# Patient Record
Sex: Female | Born: 1935 | Race: White | Hispanic: No | Smoking: Former smoker
Health system: Southern US, Community
[De-identification: ages and names within clinical notes are randomized; demographics above are authoritative.]

## PROBLEM LIST (undated history)

## (undated) DIAGNOSIS — F329 Major depressive disorder, single episode, unspecified: Secondary | ICD-10-CM

## (undated) DIAGNOSIS — F419 Anxiety disorder, unspecified: Secondary | ICD-10-CM

## (undated) DIAGNOSIS — C159 Malignant neoplasm of esophagus, unspecified: Secondary | ICD-10-CM

## (undated) DIAGNOSIS — I509 Heart failure, unspecified: Secondary | ICD-10-CM

## (undated) DIAGNOSIS — E785 Hyperlipidemia, unspecified: Secondary | ICD-10-CM

## (undated) DIAGNOSIS — N289 Disorder of kidney and ureter, unspecified: Secondary | ICD-10-CM

## (undated) DIAGNOSIS — C3490 Malignant neoplasm of unspecified part of unspecified bronchus or lung: Secondary | ICD-10-CM

## (undated) DIAGNOSIS — D649 Anemia, unspecified: Secondary | ICD-10-CM

## (undated) DIAGNOSIS — F32A Depression, unspecified: Secondary | ICD-10-CM

## (undated) DIAGNOSIS — M1990 Unspecified osteoarthritis, unspecified site: Secondary | ICD-10-CM

## (undated) DIAGNOSIS — K219 Gastro-esophageal reflux disease without esophagitis: Secondary | ICD-10-CM

## (undated) DIAGNOSIS — I10 Essential (primary) hypertension: Secondary | ICD-10-CM

## (undated) DIAGNOSIS — R55 Syncope and collapse: Secondary | ICD-10-CM

## (undated) HISTORY — DX: Essential (primary) hypertension: I10

## (undated) HISTORY — DX: Hyperlipidemia, unspecified: E78.5

## (undated) HISTORY — DX: Gastro-esophageal reflux disease without esophagitis: K21.9

## (undated) HISTORY — DX: Syncope and collapse: R55

## (undated) HISTORY — DX: Anemia, unspecified: D64.9

## (undated) HISTORY — PX: ESOPHAGEAL DILATION: SHX303

## (undated) HISTORY — PX: CAROTID ENDARTERECTOMY: SUR193

## (undated) HISTORY — PX: CARDIAC CATHETERIZATION: SHX172

## (undated) HISTORY — DX: Disorder of kidney and ureter, unspecified: N28.9

## (undated) HISTORY — PX: PARTIAL HYSTERECTOMY: SHX80

## (undated) HISTORY — PX: INSERT / REPLACE / REMOVE PACEMAKER: SUR710

## (undated) HISTORY — DX: Depression, unspecified: F32.A

## (undated) HISTORY — DX: Major depressive disorder, single episode, unspecified: F32.9

## (undated) HISTORY — DX: Unspecified osteoarthritis, unspecified site: M19.90

## (undated) HISTORY — DX: Malignant neoplasm of unspecified part of unspecified bronchus or lung: C34.90

## (undated) HISTORY — PX: WRIST FRACTURE SURGERY: SHX121

## (undated) HISTORY — DX: Malignant neoplasm of esophagus, unspecified: C15.9

## (undated) HISTORY — DX: Anxiety disorder, unspecified: F41.9

---

## 2003-11-13 ENCOUNTER — Ambulatory Visit (HOSPITAL_COMMUNITY): Admission: RE | Admit: 2003-11-13 | Discharge: 2003-11-13 | Payer: Self-pay

## 2004-01-07 ENCOUNTER — Ambulatory Visit (HOSPITAL_COMMUNITY): Admission: RE | Admit: 2004-01-07 | Discharge: 2004-01-08 | Payer: Self-pay

## 2004-05-12 ENCOUNTER — Ambulatory Visit: Payer: Self-pay | Admitting: Gastroenterology

## 2004-05-27 ENCOUNTER — Emergency Department: Payer: Self-pay | Admitting: General Practice

## 2004-06-02 ENCOUNTER — Ambulatory Visit: Payer: Self-pay | Admitting: Internal Medicine

## 2004-07-22 ENCOUNTER — Emergency Department: Payer: Self-pay | Admitting: Emergency Medicine

## 2004-09-23 ENCOUNTER — Ambulatory Visit: Payer: Self-pay | Admitting: Ophthalmology

## 2004-10-04 ENCOUNTER — Emergency Department: Payer: Self-pay | Admitting: Emergency Medicine

## 2004-11-17 ENCOUNTER — Ambulatory Visit: Payer: Self-pay | Admitting: Ophthalmology

## 2005-03-27 ENCOUNTER — Ambulatory Visit: Payer: Self-pay | Admitting: Gastroenterology

## 2005-04-23 ENCOUNTER — Ambulatory Visit: Payer: Self-pay | Admitting: General Practice

## 2005-04-24 ENCOUNTER — Emergency Department: Payer: Self-pay | Admitting: Unknown Physician Specialty

## 2005-04-26 ENCOUNTER — Ambulatory Visit: Payer: Self-pay | Admitting: General Practice

## 2005-08-05 ENCOUNTER — Ambulatory Visit: Payer: Self-pay | Admitting: General Practice

## 2005-09-18 ENCOUNTER — Ambulatory Visit: Payer: Self-pay | Admitting: Internal Medicine

## 2005-11-24 ENCOUNTER — Ambulatory Visit: Payer: Self-pay | Admitting: Internal Medicine

## 2005-12-01 ENCOUNTER — Ambulatory Visit: Payer: Self-pay | Admitting: Internal Medicine

## 2005-12-10 ENCOUNTER — Ambulatory Visit: Payer: Self-pay | Admitting: Orthopaedic Surgery

## 2006-02-04 ENCOUNTER — Other Ambulatory Visit: Payer: Self-pay

## 2006-02-05 ENCOUNTER — Ambulatory Visit: Payer: Self-pay | Admitting: General Practice

## 2006-03-05 ENCOUNTER — Encounter: Payer: Self-pay | Admitting: General Practice

## 2006-06-15 ENCOUNTER — Ambulatory Visit: Payer: Self-pay | Admitting: Internal Medicine

## 2006-06-16 ENCOUNTER — Ambulatory Visit: Payer: Self-pay | Admitting: Internal Medicine

## 2006-06-25 ENCOUNTER — Emergency Department: Payer: Self-pay | Admitting: General Practice

## 2006-06-26 ENCOUNTER — Ambulatory Visit: Payer: Self-pay | Admitting: Internal Medicine

## 2006-12-24 ENCOUNTER — Ambulatory Visit: Payer: Self-pay | Admitting: Internal Medicine

## 2007-01-03 ENCOUNTER — Ambulatory Visit: Payer: Self-pay | Admitting: Internal Medicine

## 2007-01-04 ENCOUNTER — Ambulatory Visit: Payer: Self-pay | Admitting: Internal Medicine

## 2007-01-08 ENCOUNTER — Ambulatory Visit: Payer: Self-pay | Admitting: Internal Medicine

## 2007-08-10 ENCOUNTER — Ambulatory Visit: Payer: Self-pay | Admitting: Internal Medicine

## 2007-08-28 ENCOUNTER — Emergency Department: Payer: Self-pay | Admitting: Emergency Medicine

## 2008-02-09 ENCOUNTER — Ambulatory Visit: Payer: Self-pay | Admitting: Internal Medicine

## 2008-02-15 ENCOUNTER — Ambulatory Visit: Payer: Self-pay

## 2008-02-15 ENCOUNTER — Other Ambulatory Visit: Payer: Self-pay

## 2008-02-20 ENCOUNTER — Emergency Department: Payer: Self-pay | Admitting: Emergency Medicine

## 2008-02-21 ENCOUNTER — Ambulatory Visit: Payer: Self-pay | Admitting: Unknown Physician Specialty

## 2008-05-04 ENCOUNTER — Ambulatory Visit: Payer: Self-pay | Admitting: Internal Medicine

## 2009-03-04 ENCOUNTER — Other Ambulatory Visit: Payer: Self-pay | Admitting: Unknown Physician Specialty

## 2009-03-05 ENCOUNTER — Ambulatory Visit: Payer: Self-pay | Admitting: Unknown Physician Specialty

## 2009-03-12 ENCOUNTER — Ambulatory Visit: Payer: Self-pay | Admitting: Oncology

## 2009-03-15 ENCOUNTER — Ambulatory Visit: Payer: Self-pay | Admitting: Oncology

## 2009-03-19 ENCOUNTER — Ambulatory Visit: Payer: Self-pay | Admitting: Oncology

## 2009-03-27 ENCOUNTER — Ambulatory Visit: Payer: Self-pay | Admitting: Oncology

## 2009-04-12 ENCOUNTER — Ambulatory Visit: Payer: Self-pay | Admitting: Cardiovascular Disease

## 2009-04-12 ENCOUNTER — Ambulatory Visit: Payer: Self-pay | Admitting: General Surgery

## 2009-04-17 ENCOUNTER — Inpatient Hospital Stay: Payer: Self-pay | Admitting: Surgery

## 2009-04-26 ENCOUNTER — Ambulatory Visit: Payer: Self-pay | Admitting: Oncology

## 2009-05-03 ENCOUNTER — Ambulatory Visit: Payer: Self-pay | Admitting: General Surgery

## 2009-05-27 ENCOUNTER — Ambulatory Visit: Payer: Self-pay | Admitting: Oncology

## 2009-06-26 ENCOUNTER — Ambulatory Visit: Payer: Self-pay | Admitting: Oncology

## 2009-07-27 ENCOUNTER — Ambulatory Visit: Payer: Self-pay | Admitting: Oncology

## 2009-09-10 ENCOUNTER — Ambulatory Visit: Payer: Self-pay | Admitting: Oncology

## 2009-09-24 ENCOUNTER — Ambulatory Visit: Payer: Self-pay | Admitting: Oncology

## 2009-10-25 ENCOUNTER — Ambulatory Visit: Payer: Self-pay | Admitting: Oncology

## 2009-11-11 ENCOUNTER — Ambulatory Visit: Payer: Self-pay | Admitting: Internal Medicine

## 2009-11-24 ENCOUNTER — Ambulatory Visit: Payer: Self-pay | Admitting: Oncology

## 2009-12-10 ENCOUNTER — Ambulatory Visit: Payer: Self-pay | Admitting: Unknown Physician Specialty

## 2009-12-24 ENCOUNTER — Ambulatory Visit: Payer: Self-pay

## 2009-12-24 ENCOUNTER — Ambulatory Visit: Payer: Self-pay | Admitting: Oncology

## 2009-12-25 ENCOUNTER — Ambulatory Visit: Payer: Self-pay

## 2009-12-25 ENCOUNTER — Ambulatory Visit: Payer: Self-pay | Admitting: Oncology

## 2009-12-26 ENCOUNTER — Ambulatory Visit: Payer: Self-pay | Admitting: Oncology

## 2010-01-24 ENCOUNTER — Ambulatory Visit: Payer: Self-pay | Admitting: Oncology

## 2010-02-18 ENCOUNTER — Ambulatory Visit: Payer: Self-pay | Admitting: Gastroenterology

## 2010-02-24 ENCOUNTER — Ambulatory Visit: Payer: Self-pay | Admitting: Oncology

## 2010-02-24 ENCOUNTER — Ambulatory Visit: Payer: Self-pay | Admitting: Gastroenterology

## 2010-03-08 ENCOUNTER — Inpatient Hospital Stay: Payer: Self-pay | Admitting: Internal Medicine

## 2010-03-27 ENCOUNTER — Ambulatory Visit: Payer: Self-pay | Admitting: Oncology

## 2010-04-18 ENCOUNTER — Ambulatory Visit: Payer: Self-pay | Admitting: Internal Medicine

## 2010-04-26 ENCOUNTER — Ambulatory Visit: Payer: Self-pay | Admitting: Oncology

## 2010-05-27 ENCOUNTER — Ambulatory Visit: Payer: Self-pay | Admitting: Oncology

## 2010-06-26 ENCOUNTER — Ambulatory Visit: Payer: Self-pay | Admitting: Oncology

## 2010-07-27 ENCOUNTER — Ambulatory Visit: Payer: Self-pay | Admitting: Oncology

## 2010-08-27 ENCOUNTER — Ambulatory Visit: Payer: Self-pay | Admitting: Oncology

## 2010-09-01 ENCOUNTER — Ambulatory Visit: Payer: Self-pay | Admitting: Internal Medicine

## 2010-09-15 ENCOUNTER — Ambulatory Visit: Payer: Self-pay | Admitting: Vascular Surgery

## 2010-09-25 ENCOUNTER — Ambulatory Visit: Payer: Self-pay | Admitting: Oncology

## 2010-10-26 ENCOUNTER — Ambulatory Visit: Payer: Self-pay | Admitting: Oncology

## 2010-11-25 ENCOUNTER — Ambulatory Visit: Payer: Self-pay | Admitting: Oncology

## 2010-12-26 ENCOUNTER — Ambulatory Visit: Payer: Self-pay | Admitting: Oncology

## 2011-01-25 ENCOUNTER — Ambulatory Visit: Payer: Self-pay | Admitting: Oncology

## 2011-02-17 ENCOUNTER — Ambulatory Visit: Payer: Self-pay | Admitting: Oncology

## 2011-02-25 ENCOUNTER — Ambulatory Visit: Payer: Self-pay | Admitting: Oncology

## 2011-03-28 ENCOUNTER — Ambulatory Visit: Payer: Self-pay | Admitting: Oncology

## 2011-04-27 ENCOUNTER — Ambulatory Visit: Payer: Self-pay | Admitting: Oncology

## 2011-05-28 ENCOUNTER — Ambulatory Visit: Payer: Self-pay | Admitting: Oncology

## 2011-06-27 ENCOUNTER — Ambulatory Visit: Payer: Self-pay | Admitting: Oncology

## 2011-07-31 ENCOUNTER — Ambulatory Visit: Payer: Self-pay | Admitting: Oncology

## 2011-07-31 LAB — CBC CANCER CENTER
Basophil #: 0 x10 3/mm (ref 0.0–0.1)
Eosinophil #: 0 x10 3/mm (ref 0.0–0.7)
HCT: 33.3 % — ABNORMAL LOW (ref 35.0–47.0)
Platelet: 213 x10 3/mm (ref 150–440)
RBC: 3.27 10*6/uL — ABNORMAL LOW (ref 3.80–5.20)
RDW: 12.8 % (ref 11.5–14.5)
WBC: 6.6 x10 3/mm (ref 3.6–11.0)

## 2011-07-31 LAB — COMPREHENSIVE METABOLIC PANEL
Albumin: 3.8 g/dL (ref 3.4–5.0)
Alkaline Phosphatase: 149 U/L — ABNORMAL HIGH (ref 50–136)
Calcium, Total: 9.2 mg/dL (ref 8.5–10.1)
EGFR (Non-African Amer.): 29 — ABNORMAL LOW
Glucose: 135 mg/dL — ABNORMAL HIGH (ref 65–99)
Osmolality: 280 (ref 275–301)
SGPT (ALT): 19 U/L
Sodium: 136 mmol/L (ref 136–145)

## 2011-08-28 ENCOUNTER — Ambulatory Visit: Payer: Self-pay | Admitting: Oncology

## 2013-02-20 ENCOUNTER — Ambulatory Visit: Payer: Self-pay | Admitting: Gastroenterology

## 2013-04-13 ENCOUNTER — Ambulatory Visit: Payer: Self-pay | Admitting: Internal Medicine

## 2013-04-20 ENCOUNTER — Ambulatory Visit: Payer: Self-pay | Admitting: Gastroenterology

## 2013-04-24 LAB — PATHOLOGY REPORT

## 2013-08-15 ENCOUNTER — Inpatient Hospital Stay: Payer: Self-pay | Admitting: Internal Medicine

## 2013-08-15 LAB — BASIC METABOLIC PANEL
Anion Gap: 9 (ref 7–16)
BUN: 30 mg/dL — AB (ref 7–18)
CHLORIDE: 111 mmol/L — AB (ref 98–107)
CREATININE: 1.6 mg/dL — AB (ref 0.60–1.30)
Calcium, Total: 9.1 mg/dL (ref 8.5–10.1)
Co2: 22 mmol/L (ref 21–32)
EGFR (African American): 34 — ABNORMAL LOW
EGFR (Non-African Amer.): 30 — ABNORMAL LOW
Glucose: 98 mg/dL (ref 65–99)
Osmolality: 289 (ref 275–301)
POTASSIUM: 4.3 mmol/L (ref 3.5–5.1)
SODIUM: 142 mmol/L (ref 136–145)

## 2013-08-15 LAB — PROTIME-INR
INR: 1
PROTHROMBIN TIME: 13.3 s (ref 11.5–14.7)

## 2013-08-15 LAB — TROPONIN I
TROPONIN-I: 0.07 ng/mL — AB
TROPONIN-I: 0.05 ng/mL

## 2013-08-15 LAB — CBC WITH DIFFERENTIAL/PLATELET
BASOS PCT: 0.2 %
Basophil #: 0 10*3/uL (ref 0.0–0.1)
Eosinophil #: 0.1 10*3/uL (ref 0.0–0.7)
Eosinophil %: 1.2 %
HCT: 30.3 % — AB (ref 35.0–47.0)
HGB: 10.2 g/dL — AB (ref 12.0–16.0)
Lymphocyte #: 0.5 10*3/uL — ABNORMAL LOW (ref 1.0–3.6)
Lymphocyte %: 8.4 %
MCH: 31.8 pg (ref 26.0–34.0)
MCHC: 33.7 g/dL (ref 32.0–36.0)
MCV: 94 fL (ref 80–100)
MONOS PCT: 9.9 %
Monocyte #: 0.6 x10 3/mm (ref 0.2–0.9)
NEUTROS ABS: 4.8 10*3/uL (ref 1.4–6.5)
Neutrophil %: 80.3 %
PLATELETS: 153 10*3/uL (ref 150–440)
RBC: 3.22 10*6/uL — AB (ref 3.80–5.20)
RDW: 15.9 % — AB (ref 11.5–14.5)
WBC: 6 10*3/uL (ref 3.6–11.0)

## 2013-08-15 LAB — APTT
ACTIVATED PTT: 32 s (ref 23.6–35.9)
Activated PTT: 131.5 secs — ABNORMAL HIGH (ref 23.6–35.9)

## 2013-08-15 LAB — CK TOTAL AND CKMB (NOT AT ARMC)
CK, Total: 55 U/L (ref 21–215)
CK-MB: 0.9 ng/mL (ref 0.5–3.6)

## 2013-08-15 LAB — CK-MB: CK-MB: 1.3 ng/mL (ref 0.5–3.6)

## 2013-08-16 LAB — APTT: Activated PTT: 88.9 secs — ABNORMAL HIGH (ref 23.6–35.9)

## 2013-08-16 LAB — TROPONIN I: TROPONIN-I: 0.06 ng/mL — AB

## 2013-08-16 LAB — LIPID PANEL
Cholesterol: 107 mg/dL (ref 0–200)
HDL Cholesterol: 63 mg/dL — ABNORMAL HIGH (ref 40–60)
LDL CHOLESTEROL, CALC: 34 mg/dL (ref 0–100)
TRIGLYCERIDES: 51 mg/dL (ref 0–200)
VLDL Cholesterol, Calc: 10 mg/dL (ref 5–40)

## 2013-08-16 LAB — CK-MB: CK-MB: 1 ng/mL (ref 0.5–3.6)

## 2013-08-17 LAB — BASIC METABOLIC PANEL
ANION GAP: 5 — AB (ref 7–16)
BUN: 37 mg/dL — AB (ref 7–18)
CHLORIDE: 106 mmol/L (ref 98–107)
CREATININE: 1.98 mg/dL — AB (ref 0.60–1.30)
Calcium, Total: 9.1 mg/dL (ref 8.5–10.1)
Co2: 24 mmol/L (ref 21–32)
EGFR (African American): 27 — ABNORMAL LOW
GFR CALC NON AF AMER: 23 — AB
Glucose: 85 mg/dL (ref 65–99)
OSMOLALITY: 278 (ref 275–301)
POTASSIUM: 4.5 mmol/L (ref 3.5–5.1)
SODIUM: 135 mmol/L — AB (ref 136–145)

## 2013-08-17 LAB — CBC WITH DIFFERENTIAL/PLATELET
BASOS ABS: 0 10*3/uL (ref 0.0–0.1)
Basophil %: 0.3 %
EOS ABS: 0.1 10*3/uL (ref 0.0–0.7)
Eosinophil %: 2.3 %
HCT: 28.3 % — ABNORMAL LOW (ref 35.0–47.0)
HGB: 9.5 g/dL — ABNORMAL LOW (ref 12.0–16.0)
LYMPHS ABS: 0.6 10*3/uL — AB (ref 1.0–3.6)
Lymphocyte %: 11 %
MCH: 31.4 pg (ref 26.0–34.0)
MCHC: 33.5 g/dL (ref 32.0–36.0)
MCV: 94 fL (ref 80–100)
MONOS PCT: 16 %
Monocyte #: 0.8 x10 3/mm (ref 0.2–0.9)
NEUTROS PCT: 70.4 %
Neutrophil #: 3.7 10*3/uL (ref 1.4–6.5)
PLATELETS: 149 10*3/uL — AB (ref 150–440)
RBC: 3.02 10*6/uL — ABNORMAL LOW (ref 3.80–5.20)
RDW: 15.8 % — AB (ref 11.5–14.5)
WBC: 5.2 10*3/uL (ref 3.6–11.0)

## 2013-09-04 ENCOUNTER — Ambulatory Visit: Payer: Self-pay | Admitting: Oncology

## 2013-09-06 LAB — CBC CANCER CENTER
BASOS ABS: 0 x10 3/mm (ref 0.0–0.1)
BASOS PCT: 0.4 %
Eosinophil #: 0.1 x10 3/mm (ref 0.0–0.7)
Eosinophil %: 1.1 %
HCT: 26.4 % — AB (ref 35.0–47.0)
HGB: 8.4 g/dL — ABNORMAL LOW (ref 12.0–16.0)
Lymphocyte #: 0.8 x10 3/mm — ABNORMAL LOW (ref 1.0–3.6)
Lymphocyte %: 12.4 %
MCH: 29.8 pg (ref 26.0–34.0)
MCHC: 31.9 g/dL — ABNORMAL LOW (ref 32.0–36.0)
MCV: 94 fL (ref 80–100)
Monocyte #: 1 x10 3/mm — ABNORMAL HIGH (ref 0.2–0.9)
Monocyte %: 17 %
NEUTROS PCT: 69.1 %
Neutrophil #: 4.2 x10 3/mm (ref 1.4–6.5)
Platelet: 182 x10 3/mm (ref 150–440)
RBC: 2.83 10*6/uL — AB (ref 3.80–5.20)
RDW: 18.3 % — ABNORMAL HIGH (ref 11.5–14.5)
WBC: 6 x10 3/mm (ref 3.6–11.0)

## 2013-09-06 LAB — IRON AND TIBC
Iron Bind.Cap.(Total): 504 ug/dL — ABNORMAL HIGH (ref 250–450)
Iron Saturation: 5 %
Iron: 26 ug/dL — ABNORMAL LOW (ref 50–170)
Unbound Iron-Bind.Cap.: 478 ug/dL

## 2013-09-06 LAB — FERRITIN: FERRITIN (ARMC): 5 ng/mL — AB (ref 8–388)

## 2013-09-22 LAB — CBC CANCER CENTER
BASOS PCT: 0.4 %
Basophil #: 0 x10 3/mm (ref 0.0–0.1)
EOS PCT: 1.4 %
Eosinophil #: 0.1 x10 3/mm (ref 0.0–0.7)
HCT: 34.9 % — AB (ref 35.0–47.0)
HGB: 11.3 g/dL — AB (ref 12.0–16.0)
LYMPHS ABS: 0.6 x10 3/mm — AB (ref 1.0–3.6)
LYMPHS PCT: 13.7 %
MCH: 30.5 pg (ref 26.0–34.0)
MCHC: 32.3 g/dL (ref 32.0–36.0)
MCV: 95 fL (ref 80–100)
MONO ABS: 0.7 x10 3/mm (ref 0.2–0.9)
MONOS PCT: 14.4 %
NEUTROS ABS: 3.3 x10 3/mm (ref 1.4–6.5)
Neutrophil %: 70.1 %
Platelet: 197 x10 3/mm (ref 150–440)
RBC: 3.69 10*6/uL — AB (ref 3.80–5.20)
RDW: 21.3 % — ABNORMAL HIGH (ref 11.5–14.5)
WBC: 4.7 x10 3/mm (ref 3.6–11.0)

## 2013-09-22 LAB — COMPREHENSIVE METABOLIC PANEL
ALBUMIN: 3.9 g/dL (ref 3.4–5.0)
ALK PHOS: 110 U/L
ALT: 23 U/L (ref 12–78)
AST: 20 U/L (ref 15–37)
Anion Gap: 11 (ref 7–16)
BILIRUBIN TOTAL: 0.4 mg/dL (ref 0.2–1.0)
BUN: 33 mg/dL — ABNORMAL HIGH (ref 7–18)
CALCIUM: 8.9 mg/dL (ref 8.5–10.1)
Chloride: 107 mmol/L (ref 98–107)
Co2: 22 mmol/L (ref 21–32)
Creatinine: 1.77 mg/dL — ABNORMAL HIGH (ref 0.60–1.30)
EGFR (African American): 30 — ABNORMAL LOW
EGFR (Non-African Amer.): 26 — ABNORMAL LOW
GLUCOSE: 100 mg/dL — AB (ref 65–99)
OSMOLALITY: 287 (ref 275–301)
Potassium: 4.6 mmol/L (ref 3.5–5.1)
Sodium: 140 mmol/L (ref 136–145)
TOTAL PROTEIN: 7.2 g/dL (ref 6.4–8.2)

## 2013-09-24 ENCOUNTER — Ambulatory Visit: Payer: Self-pay | Admitting: Oncology

## 2013-09-29 ENCOUNTER — Emergency Department: Payer: Self-pay

## 2013-09-29 LAB — COMPREHENSIVE METABOLIC PANEL
ALBUMIN: 4.2 g/dL (ref 3.4–5.0)
ALK PHOS: 121 U/L — AB
ANION GAP: 3 — AB (ref 7–16)
AST: 35 U/L (ref 15–37)
BUN: 31 mg/dL — AB (ref 7–18)
Bilirubin,Total: 0.7 mg/dL (ref 0.2–1.0)
CALCIUM: 9.1 mg/dL (ref 8.5–10.1)
CHLORIDE: 110 mmol/L — AB (ref 98–107)
Co2: 24 mmol/L (ref 21–32)
Creatinine: 1.63 mg/dL — ABNORMAL HIGH (ref 0.60–1.30)
EGFR (African American): 34 — ABNORMAL LOW
GFR CALC NON AF AMER: 29 — AB
GLUCOSE: 105 mg/dL — AB (ref 65–99)
Osmolality: 281 (ref 275–301)
Potassium: 4.4 mmol/L (ref 3.5–5.1)
SGPT (ALT): 25 U/L (ref 12–78)
SODIUM: 137 mmol/L (ref 136–145)
Total Protein: 7.5 g/dL (ref 6.4–8.2)

## 2013-09-29 LAB — CBC
HCT: 35.3 % (ref 35.0–47.0)
HGB: 11.9 g/dL — ABNORMAL LOW (ref 12.0–16.0)
MCH: 31.8 pg (ref 26.0–34.0)
MCHC: 33.6 g/dL (ref 32.0–36.0)
MCV: 95 fL (ref 80–100)
Platelet: 143 10*3/uL — ABNORMAL LOW (ref 150–440)
RBC: 3.73 10*6/uL — AB (ref 3.80–5.20)
RDW: 21.4 % — ABNORMAL HIGH (ref 11.5–14.5)
WBC: 5 10*3/uL (ref 3.6–11.0)

## 2013-09-29 LAB — TROPONIN I: Troponin-I: <0.02 ng/mL

## 2013-11-02 ENCOUNTER — Ambulatory Visit: Payer: Self-pay | Admitting: Oncology

## 2013-11-03 ENCOUNTER — Inpatient Hospital Stay: Payer: Self-pay | Admitting: Internal Medicine

## 2013-11-03 LAB — IRON AND TIBC
IRON SATURATION: 46 %
Iron Bind.Cap.(Total): 250 ug/dL (ref 250–450)
Iron: 115 ug/dL (ref 50–170)
Unbound Iron-Bind.Cap.: 135 ug/dL

## 2013-11-03 LAB — TROPONIN I: Troponin-I: 0.13 ng/mL — ABNORMAL HIGH

## 2013-11-03 LAB — CBC CANCER CENTER
BASOS ABS: 0 x10 3/mm (ref 0.0–0.1)
Basophil %: 0.4 %
EOS ABS: 0.1 x10 3/mm (ref 0.0–0.7)
EOS PCT: 1.9 %
HCT: 33.5 % — ABNORMAL LOW (ref 35.0–47.0)
HGB: 11.1 g/dL — ABNORMAL LOW (ref 12.0–16.0)
LYMPHS ABS: 0.6 x10 3/mm — AB (ref 1.0–3.6)
Lymphocyte %: 9.5 %
MCH: 32.3 pg (ref 26.0–34.0)
MCHC: 33.2 g/dL (ref 32.0–36.0)
MCV: 97 fL (ref 80–100)
MONO ABS: 0.9 x10 3/mm (ref 0.2–0.9)
Monocyte %: 12.8 %
NEUTROS ABS: 5.2 x10 3/mm (ref 1.4–6.5)
NEUTROS PCT: 75.4 %
Platelet: 129 x10 3/mm — ABNORMAL LOW (ref 150–440)
RBC: 3.45 10*6/uL — AB (ref 3.80–5.20)
RDW: 20.7 % — ABNORMAL HIGH (ref 11.5–14.5)
WBC: 6.9 x10 3/mm (ref 3.6–11.0)

## 2013-11-03 LAB — URINALYSIS, COMPLETE
BILIRUBIN, UR: NEGATIVE
BLOOD: NEGATIVE
Bacteria: NONE SEEN
GLUCOSE, UR: NEGATIVE mg/dL (ref 0–75)
Ketone: NEGATIVE
NITRITE: NEGATIVE
PH: 5 (ref 4.5–8.0)
SPECIFIC GRAVITY: 1.017 (ref 1.003–1.030)
Squamous Epithelial: <1
WBC UR: 9 /HPF (ref 0–5)

## 2013-11-03 LAB — APTT: Activated PTT: 32.5 secs (ref 23.6–35.9)

## 2013-11-03 LAB — CK TOTAL AND CKMB (NOT AT ARMC)
CK, Total: 45 U/L
CK-MB: 1 ng/mL (ref 0.5–3.6)

## 2013-11-03 LAB — COMPREHENSIVE METABOLIC PANEL
ALK PHOS: 121 U/L — AB
ANION GAP: 9 (ref 7–16)
AST: 94 U/L — AB (ref 15–37)
Albumin: 3.4 g/dL (ref 3.4–5.0)
BUN: 34 mg/dL — ABNORMAL HIGH (ref 7–18)
Bilirubin,Total: 0.8 mg/dL (ref 0.2–1.0)
CALCIUM: 8.9 mg/dL (ref 8.5–10.1)
CREATININE: 2.04 mg/dL — AB (ref 0.60–1.30)
Chloride: 107 mmol/L (ref 98–107)
Co2: 22 mmol/L (ref 21–32)
GFR CALC AF AMER: 26 — AB
GFR CALC NON AF AMER: 22 — AB
Glucose: 107 mg/dL — ABNORMAL HIGH (ref 65–99)
Osmolality: 284 (ref 275–301)
Potassium: 4.2 mmol/L (ref 3.5–5.1)
SGPT (ALT): 95 U/L — ABNORMAL HIGH (ref 12–78)
Sodium: 138 mmol/L (ref 136–145)
TOTAL PROTEIN: 6.5 g/dL (ref 6.4–8.2)

## 2013-11-03 LAB — PROTIME-INR
INR: 1.1
Prothrombin Time: 14 secs (ref 11.5–14.7)

## 2013-11-03 LAB — MAGNESIUM: MAGNESIUM: 1.9 mg/dL

## 2013-11-03 LAB — FERRITIN: Ferritin (ARMC): 250 ng/mL (ref 8–388)

## 2013-11-04 LAB — LIPID PANEL
CHOLESTEROL: 96 mg/dL (ref 0–200)
HDL Cholesterol: 46 mg/dL (ref 40–60)
Ldl Cholesterol, Calc: 33 mg/dL (ref 0–100)
Triglycerides: 83 mg/dL (ref 0–200)
VLDL Cholesterol, Calc: 17 mg/dL (ref 5–40)

## 2013-11-04 LAB — CBC WITH DIFFERENTIAL/PLATELET
BASOS PCT: 0.2 %
Basophil #: 0 10*3/uL (ref 0.0–0.1)
EOS PCT: 2 %
Eosinophil #: 0.1 10*3/uL (ref 0.0–0.7)
HCT: 31 % — ABNORMAL LOW (ref 35.0–47.0)
HGB: 10.2 g/dL — ABNORMAL LOW (ref 12.0–16.0)
Lymphocyte #: 0.3 10*3/uL — ABNORMAL LOW (ref 1.0–3.6)
Lymphocyte %: 5.3 %
MCH: 31.8 pg (ref 26.0–34.0)
MCHC: 32.8 g/dL (ref 32.0–36.0)
MCV: 97 fL (ref 80–100)
MONOS PCT: 12.5 %
Monocyte #: 0.8 x10 3/mm (ref 0.2–0.9)
NEUTROS ABS: 4.9 10*3/uL (ref 1.4–6.5)
NEUTROS PCT: 80 %
Platelet: 105 10*3/uL — ABNORMAL LOW (ref 150–440)
RBC: 3.2 10*6/uL — AB (ref 3.80–5.20)
RDW: 21.1 % — AB (ref 11.5–14.5)
WBC: 6.2 10*3/uL (ref 3.6–11.0)

## 2013-11-04 LAB — HEPATIC FUNCTION PANEL A (ARMC)
ALBUMIN: 3.2 g/dL — AB (ref 3.4–5.0)
ALK PHOS: 101 U/L
Bilirubin, Direct: 0.2 mg/dL (ref 0.00–0.20)
Bilirubin,Total: 0.6 mg/dL (ref 0.2–1.0)
SGOT(AST): 49 U/L — ABNORMAL HIGH (ref 15–37)
SGPT (ALT): 61 U/L (ref 12–78)
TOTAL PROTEIN: 5.9 g/dL — AB (ref 6.4–8.2)

## 2013-11-04 LAB — BASIC METABOLIC PANEL
ANION GAP: 7 (ref 7–16)
BUN: 30 mg/dL — AB (ref 7–18)
CHLORIDE: 111 mmol/L — AB (ref 98–107)
CO2: 22 mmol/L (ref 21–32)
CREATININE: 1.72 mg/dL — AB (ref 0.60–1.30)
Calcium, Total: 9 mg/dL (ref 8.5–10.1)
GFR CALC AF AMER: 32 — AB
GFR CALC NON AF AMER: 27 — AB
GLUCOSE: 95 mg/dL (ref 65–99)
Osmolality: 285 (ref 275–301)
Potassium: 4.3 mmol/L (ref 3.5–5.1)
SODIUM: 140 mmol/L (ref 136–145)

## 2013-11-24 ENCOUNTER — Ambulatory Visit: Payer: Self-pay | Admitting: Oncology

## 2013-12-05 DIAGNOSIS — K219 Gastro-esophageal reflux disease without esophagitis: Secondary | ICD-10-CM | POA: Insufficient documentation

## 2013-12-12 ENCOUNTER — Ambulatory Visit: Payer: Self-pay | Admitting: Gastroenterology

## 2013-12-25 ENCOUNTER — Ambulatory Visit: Payer: Self-pay | Admitting: Gastroenterology

## 2013-12-29 ENCOUNTER — Ambulatory Visit: Payer: Self-pay | Admitting: Gastroenterology

## 2014-01-22 ENCOUNTER — Inpatient Hospital Stay: Payer: Self-pay | Admitting: Internal Medicine

## 2014-01-22 LAB — CK TOTAL AND CKMB (NOT AT ARMC)
CK, Total: 55 U/L
CK-MB: 2.6 ng/mL (ref 0.5–3.6)

## 2014-01-22 LAB — BASIC METABOLIC PANEL
ANION GAP: 16 (ref 7–16)
BUN: 31 mg/dL — AB (ref 7–18)
CALCIUM: 9 mg/dL (ref 8.5–10.1)
CHLORIDE: 103 mmol/L (ref 98–107)
Co2: 20 mmol/L — ABNORMAL LOW (ref 21–32)
Creatinine: 1.77 mg/dL — ABNORMAL HIGH (ref 0.60–1.30)
EGFR (African American): 30 — ABNORMAL LOW
EGFR (Non-African Amer.): 26 — ABNORMAL LOW
Glucose: 97 mg/dL (ref 65–99)
Osmolality: 284 (ref 275–301)
POTASSIUM: 4.2 mmol/L (ref 3.5–5.1)
Sodium: 139 mmol/L (ref 136–145)

## 2014-01-22 LAB — CBC
HCT: 36.1 % (ref 35.0–47.0)
HGB: 12 g/dL (ref 12.0–16.0)
MCH: 34.1 pg — ABNORMAL HIGH (ref 26.0–34.0)
MCHC: 33.2 g/dL (ref 32.0–36.0)
MCV: 103 fL — ABNORMAL HIGH (ref 80–100)
Platelet: 163 10*3/uL (ref 150–440)
RBC: 3.51 10*6/uL — AB (ref 3.80–5.20)
RDW: 13.6 % (ref 11.5–14.5)
WBC: 6.5 10*3/uL (ref 3.6–11.0)

## 2014-01-22 LAB — TROPONIN I
TROPONIN-I: 0.24 ng/mL — AB
Troponin-I: 0.27 ng/mL — ABNORMAL HIGH

## 2014-01-22 LAB — PRO B NATRIURETIC PEPTIDE: B-Type Natriuretic Peptide: 33098 pg/mL — ABNORMAL HIGH (ref 0–450)

## 2014-01-22 LAB — TSH: Thyroid Stimulating Horm: 1.48 u[IU]/mL

## 2014-01-24 LAB — CBC WITH DIFFERENTIAL/PLATELET
Basophil #: 0 10*3/uL (ref 0.0–0.1)
Basophil %: 0.4 %
EOS ABS: 0.1 10*3/uL (ref 0.0–0.7)
EOS PCT: 2 %
HCT: 36 % (ref 35.0–47.0)
HGB: 12.5 g/dL (ref 12.0–16.0)
LYMPHS ABS: 0.4 10*3/uL — AB (ref 1.0–3.6)
Lymphocyte %: 7.3 %
MCH: 35.6 pg — AB (ref 26.0–34.0)
MCHC: 34.7 g/dL (ref 32.0–36.0)
MCV: 102 fL — ABNORMAL HIGH (ref 80–100)
Monocyte #: 0.9 x10 3/mm (ref 0.2–0.9)
Monocyte %: 15 %
NEUTROS PCT: 75.3 %
Neutrophil #: 4.4 10*3/uL (ref 1.4–6.5)
Platelet: 163 10*3/uL (ref 150–440)
RBC: 3.51 10*6/uL — AB (ref 3.80–5.20)
RDW: 13.4 % (ref 11.5–14.5)
WBC: 5.9 10*3/uL (ref 3.6–11.0)

## 2014-01-24 LAB — BASIC METABOLIC PANEL
Anion Gap: 12 (ref 7–16)
BUN: 31 mg/dL — ABNORMAL HIGH (ref 7–18)
CHLORIDE: 101 mmol/L (ref 98–107)
CO2: 27 mmol/L (ref 21–32)
Calcium, Total: 8.9 mg/dL (ref 8.5–10.1)
Creatinine: 2 mg/dL — ABNORMAL HIGH (ref 0.60–1.30)
EGFR (Non-African Amer.): 23 — ABNORMAL LOW
GFR CALC AF AMER: 26 — AB
GLUCOSE: 73 mg/dL (ref 65–99)
Osmolality: 285 (ref 275–301)
POTASSIUM: 3.4 mmol/L — AB (ref 3.5–5.1)
SODIUM: 140 mmol/L (ref 136–145)

## 2014-01-27 LAB — CULTURE, BLOOD (SINGLE)

## 2014-02-02 ENCOUNTER — Ambulatory Visit: Payer: Self-pay | Admitting: Family

## 2014-03-05 DIAGNOSIS — R634 Abnormal weight loss: Secondary | ICD-10-CM | POA: Insufficient documentation

## 2014-03-13 ENCOUNTER — Ambulatory Visit: Payer: Self-pay | Admitting: Family

## 2014-03-27 ENCOUNTER — Ambulatory Visit: Payer: Self-pay | Admitting: Internal Medicine

## 2014-04-03 ENCOUNTER — Ambulatory Visit: Payer: Self-pay | Admitting: Family

## 2014-04-19 ENCOUNTER — Ambulatory Visit: Payer: Self-pay | Admitting: Family

## 2014-04-26 ENCOUNTER — Ambulatory Visit: Payer: Self-pay | Admitting: Internal Medicine

## 2014-05-04 ENCOUNTER — Ambulatory Visit: Payer: Self-pay | Admitting: Family

## 2014-05-06 ENCOUNTER — Emergency Department: Payer: Self-pay | Admitting: Emergency Medicine

## 2014-05-09 ENCOUNTER — Ambulatory Visit: Payer: Self-pay | Admitting: General Practice

## 2014-05-14 ENCOUNTER — Ambulatory Visit: Payer: Self-pay | Admitting: General Practice

## 2014-05-23 ENCOUNTER — Ambulatory Visit: Payer: Self-pay | Admitting: Family

## 2014-06-01 ENCOUNTER — Ambulatory Visit: Payer: Self-pay | Admitting: Internal Medicine

## 2014-08-07 ENCOUNTER — Ambulatory Visit: Payer: Self-pay | Admitting: Family

## 2014-08-20 ENCOUNTER — Emergency Department: Payer: Self-pay | Admitting: Emergency Medicine

## 2014-08-20 LAB — COMPREHENSIVE METABOLIC PANEL
ALBUMIN: 3.7 g/dL (ref 3.4–5.0)
ALK PHOS: 72 U/L
ANION GAP: 5 — AB (ref 7–16)
BILIRUBIN TOTAL: 0.6 mg/dL (ref 0.2–1.0)
BUN: 39 mg/dL — ABNORMAL HIGH (ref 7–18)
Calcium, Total: 9.2 mg/dL (ref 8.5–10.1)
Chloride: 110 mmol/L — ABNORMAL HIGH (ref 98–107)
Co2: 21 mmol/L (ref 21–32)
Creatinine: 1.8 mg/dL — ABNORMAL HIGH (ref 0.60–1.30)
EGFR (African American): 35 — ABNORMAL LOW
EGFR (Non-African Amer.): 29 — ABNORMAL LOW
GLUCOSE: 92 mg/dL (ref 65–99)
OSMOLALITY: 281 (ref 275–301)
Potassium: 5.7 mmol/L — ABNORMAL HIGH (ref 3.5–5.1)
SGOT(AST): 16 U/L (ref 15–37)
SGPT (ALT): 14 U/L
SODIUM: 136 mmol/L (ref 136–145)
TOTAL PROTEIN: 6.5 g/dL (ref 6.4–8.2)

## 2014-08-20 LAB — URINALYSIS, COMPLETE
BACTERIA: NONE SEEN
BLOOD: NEGATIVE
Bilirubin,UR: NEGATIVE
GLUCOSE, UR: NEGATIVE mg/dL (ref 0–75)
Ketone: NEGATIVE
Leukocyte Esterase: NEGATIVE
NITRITE: NEGATIVE
PH: 6 (ref 4.5–8.0)
Protein: 30
RBC,UR: 3 /HPF (ref 0–5)
Specific Gravity: 1.011 (ref 1.003–1.030)
Squamous Epithelial: NONE SEEN
WBC UR: 2 /HPF (ref 0–5)

## 2014-08-20 LAB — CBC
HCT: 37.3 % (ref 35.0–47.0)
HGB: 12 g/dL (ref 12.0–16.0)
MCH: 33.8 pg (ref 26.0–34.0)
MCHC: 32.3 g/dL (ref 32.0–36.0)
MCV: 105 fL — AB (ref 80–100)
PLATELETS: 78 10*3/uL — AB (ref 150–440)
RBC: 3.56 10*6/uL — AB (ref 3.80–5.20)
RDW: 13.4 % (ref 11.5–14.5)
WBC: 3.5 10*3/uL — ABNORMAL LOW (ref 3.6–11.0)

## 2014-08-20 LAB — TROPONIN I: TROPONIN-I: 0.03 ng/mL

## 2014-08-22 ENCOUNTER — Ambulatory Visit: Payer: Self-pay | Admitting: Family

## 2014-09-21 ENCOUNTER — Ambulatory Visit: Payer: Self-pay | Admitting: Family

## 2014-09-24 ENCOUNTER — Ambulatory Visit: Payer: Self-pay | Admitting: Oncology

## 2014-09-25 ENCOUNTER — Ambulatory Visit
Admit: 2014-09-25 | Disposition: A | Discharge status: Home / Self Care | Payer: Self-pay | Attending: Oncology | Admitting: Oncology

## 2014-10-08 ENCOUNTER — Ambulatory Visit: Payer: Self-pay | Admitting: Oncology

## 2014-10-12 DIAGNOSIS — N184 Chronic kidney disease, stage 4 (severe): Secondary | ICD-10-CM | POA: Insufficient documentation

## 2014-10-26 ENCOUNTER — Ambulatory Visit
Admit: 2014-10-26 | Disposition: A | Discharge status: Home / Self Care | Payer: Self-pay | Attending: Oncology | Admitting: Oncology

## 2014-10-27 DIAGNOSIS — I5022 Chronic systolic (congestive) heart failure: Secondary | ICD-10-CM | POA: Insufficient documentation

## 2014-11-17 NOTE — H&P (Signed)
PATIENT NAME:  Gloria Jarvis, Gloria Jarvis MR#:  [id number] DATE OF BIRTH:  October 31, 1928  DATE OF ADMISSION:  01/22/2014  PRIMARY CARE PHYSICIAN: Dr. Emily Filbert.   REFERRING EMERGENCY ROOM PHYSICIAN: Dr. Delman Kitten.   PRIMARY CARDIOLOGIST: Dr. Nehemiah Massed.   CHIEF COMPLAINT: Shortness of breath and chest pain.   HISTORY OF PRESENT ILLNESS: An 81 year old female with past history of hypertension, hyperlipidemia and she was noted to be having some conduction problem as per her in the past, so 2 months ago she received a cardiac pacemaker. After pacemaker, she has been having on and off episodes of feeling chest pain and shortness of breath. The pain is sharp, pressure-like, as if somebody is sitting on her chest, underneath her sternal bone and sometimes it happens when she is trying to walk on her treadmill. Once she takes some rest, the pain goes away. It is also associated with shortness of breath which is severe. episodes are happening more and more frequently, so finally she decided to come to the Emergency Room. Today morning, she ate her breakfast, but after she had this type of episode, she vomited and throw up her breakfast. That was the main triggering factor that made her come to the Emergency Room today after having one of these similar episodes. She did not know that her primary care physician had a walk-in clinic also where she can go and see without any appointment, so decided to come to the Emergency Room to seek help. On further questioning, she denies any sputum or fever. She does not have oxygen requirement at home. She is having some dry cough for the last few days.   REVIEW OF SYSTEMS:  CONSTITUTIONAL: Negative for fever, fatigue, weakness, pain or weight loss.  EYES: No blurring or double vision, discharge or redness.  EARS, NOSE, THROAT: No tinnitus, ear pain or hearing loss.  RESPIRATORY: The patient has of shortness of breath on walking.  CARDIOVASCULAR: Has chest pain on and off. No  palpitations, edema, arrhythmia or syncopal episode.  GASTROINTESTINAL: No nausea, vomit, diarrhea, abdominal pain. Had only 1 episode of vomiting today.    GENITOURINARY: No dysuria, hematuria or increased frequency.  ENDOCRINE: No heat or cold intolerance. No excessive sweating.  SKIN: No acne, rashes or lesions.  MUSCULOSKELETAL: No pain or swelling in the joints.  NEUROLOGICAL: No numbness, weakness, tremor or vertigo.  PSYCHIATRIC: No anxiety, insomnia, bipolar disorder.   PAST MEDICAL HISTORY:  1. Hypertension.  2. Hypercholesterolemia.  3. Chronic renal failure.  4. Status post pacemaker placement 2 months ago.   PAST SURGICAL HISTORY: Esophageal cancer. Part of esophagus and stomach were removed in 1989. She received radiation therapy for that. In 2010, she was diagnosed with lung cancer and had part of lung removed. Had some chemotherapy but did not tolerate it and decided to stop it. Had some renal failure secondary to that which is chronic now.   SOCIAL HISTORY: She was a smoker. She quit in 1989. No alcohol. No illicit drug use. Lives alone and very active in day-to-day life. No need for any support to walk.   FAMILY HISTORY: Mother and father both had coronary artery disease. Mother ended up having a pacemaker in her 24s, and father died in 27s with coronary artery disease.   HOME MEDICATIONS:  1. Pravastatin 20 mg oral once a day.  2. Omeprazole 20 mg oral 2 times a day.  3. Hydrochlorothiazide/triamterene 25/37.5 mg oral once a day.  4. Aspirin 81 mg once a day.  5. Amlodipine 10 mg oral once a day.   PHYSICAL EXAMINATION:  VITAL SIGNS: In the ER, temperature 98.1, pulse rate is 110, respirations 24, blood pressure 147/55 and pulse ox is 88 on room air which came up to 96 on oxygen supplementation 2 liters.  GENERAL: The patient is fully alert and oriented to time, place and person. She is thin. Very cooperative with history taking and physical examination. Does not  appear in any acute distress.  HEENT: Head and neck atraumatic. Conjunctiva pink. Oral mucosa moist.  NECK: Supple. No JVD.  RESPIRATORY: Bilateral crepitation present. Equal air entry.  CARDIOVASCULAR: S1, S2 present, regular. No murmur appreciated.  ABDOMEN: Soft, nontender. Bowel sounds present. No organomegaly.  SKIN: No rash.  LEGS: No edema.  NEUROLOGICAL: Power 5 out of 5. Follows commands. No gross abnormality.  PSYCHIATRIC: Does not appear in any acute psychiatric illness at this time.   IMPORTANT LABORATORY RESULTS: Cardiac catheterization done in January 2015 shows insignificant coronary artery disease, normal left ventricular ejection fraction 63%.   BNP is 33,098. Troponin 0.24. WBC 6.5, hemoglobin 12, platelet count is 163 and MCV is 103.   ASSESSMENT AND PLAN: An 81 year old female with a past medical history of hypertension, pacemaker placement, hypercholesterolemia and chronic renal failure, came to the Emergency Room after having recurrent episodes of retrosternal pain and shortness of breath and found to have elevated BNP and pulmonary edema on chest x-ray.  1. Congestive heart failure exacerbation: Most likely, this is diastolic heart failure. Ejection fraction was normal 6 months ago. This is new onset congestive heart failure, so will admit to telemetry, do troponin followups and will give intravenous Lasix. Will call cardiology consult and will get echocardiogram done to evaluate cardiac function.  2. Hypertension: Will give her intravenous Lasix and carvedilol for blood pressure control.  3. Hypercholesterolemia: Continue her pravastatin.   4. Chronic renal failure: We still do not have BMP report as there was some problem with the sample. Emergency Room physician sent it again, so we will have to follow that but she has chronic renal failure, creatinine around 2 in the past.  5. Elevated troponin of 0.24: In the setting of chronic renal failure, this might be expected,  but if it goes further higher and the patient has new onset respiratory distress with new pulmonary edema, I would like to start her on anticoagulation therapy if needed later on. Cardiology to see the patient.   CODE STATUS: DNR. Confirmed with the patient. Her healthcare power of attorney is her son.   TOTAL TIME SPENT ON THIS ADMISSION: 50 minutes.   ____________________________ Ceasar Lund Anselm Jungling, MD vgv:gb D: 01/22/2014 19:35:34 ET T: 01/23/2014 00:18:35 ET JOB#: [id number]  cc: Ceasar Lund. Anselm Jungling, MD, <Dictator> Rusty Aus, MD Vaughan Basta MD ELECTRONICALLY SIGNED 01/23/2014 16:45

## 2014-11-17 NOTE — Op Note (Signed)
PATIENT NAME:  Gloria Jarvis, Gloria Jarvis MR#:  [id number] DATE OF BIRTH:  07/03/1929  DATE OF PROCEDURE:  11/03/2013  PROCEDURE PERFORMED:  Dual-chamber pacemaker implantation (Medtronic).   PREPROCEDURE DIAGNOSES: Complete heart block with ventricular escape in the 30s, symptomatic, with acute on chronic renal insufficiency.     POSTPROCEDURE DIAGNOSES:   1.  Complete heart block with ventricular escape in the 30s, symptomatic, with acute on chronic renal insufficiency.    2.  Status post left upper extremity venogram (10 mL of IV contrast dye required secondary to low-riding clavicle and faint bone shadows with osteoporosis). Patent without focal stenosis, cine'd and road mapped for vascular access.  3.  Status post left-sided dual-chamber pacemaker implantation (Medtronic).   PRIMARY OPERATOR:  Fatima Sanger, M.D.   LOCATION:  OR #2, [facility].   MEDICATIONS:  1.  Cefazolin 1 gram IV.  2.  Sedation, monitored anesthesia care (see anesthesia note for anesthetic medications).   FLUOROSCOPY TIME:  Approximately 5 minutes (see radiology fluoroscopy note).   COMPLICATIONS: None.   ESTIMATED BLOOD LOSS:  Minimal.   INDICATIONS AND CONSENT:  Gloria Jarvis presented with nonspecific symptoms of fatigue and decreased exercise tolerance, dyspnea on exertion and was found on EKG to have complete heart block with underlying sinus rate in the 90s and a ventricular escape rate associated in the 30s, acute and chronic renal insufficiency with desire to avoid IV contrast dye; however, during the procedure, fluoroscopy images demonstrated faint bone shadows with low-riding clavicles and risk of pneumothorax substantial, prompting decision to use limited quantity of dye to guide IV access. Prior to the procedure, risks, benefits and alternatives of dual-chamber pacemaker implantation were reviewed with Gloria Jarvis and Gloria daughter-in-law with risks including, but not limited to bleeding, infection,  viscus perforation requiring urgent surgery, heart attack, stroke, death, device malfunction, thromboembolic phenomena, lung collapse, contrasted with potential to establish A-V synchrony with a dual-chamber pacemaker. Time allowed for questions and questions answered.   DESCRIPTION OF PROCEDURE:  The patient was brought to the operating room #2 in a fasting and nonsedated state. Timeout was performed. Appropriate resuscitative equipment was attached. The left infraclavicular region was prepped with chlorhexidine/ChloraPrep and draped in the usual sterile manner with Ioban over the surgical site. An incision was made in the left infraclavicular region and extended to the prepectoralis fascia to accommodate pacemaker hardware. Antibiotic-soaked radiopaque gauze was placed in the pocket. Central venous access was obtained using micropuncture needles and cine'd and road map venogram as described above with micropuncture sheath exchanged for a J-wire passed to inferior right atrium. Subsequently, the lower J-wire was used to guide a 7-French SafeSheath. Dilator and wire removed. The body sheath was aspirated and flushed and then used to deliver a Medtronic model 5076-52 cm lead to the inferior right atrium using a curved gray stylet. The lead was passed across the tricuspid valve and into the right ventricular outflow tract. This stylet was exchanged for a gray stylet with a shorter more acute curve.  Lead was withdrawn with counterclockwise torque and advanced towards the mid interventricular septum. In this region, R waves were sensed at 13.0 mV, slew rate 2.6 V/sec, impedance of 791 ohms and threshold of 0.9 V at 0.5 ms. There was no stimulation of diaphragm with 10 V of output. Adequate lead slack applied and confirmed with deep inspiration. The lead was fixed to the prepectoralis fascia with 2 separate 0 silk ties and tested with gentle traction to confirm stability. Over  the more superior J-tipped guidewire, a  7-French SafeSheath was passed. Dilator and wire were removed. The body sheath was aspirated and flushed and then used to deliver the Medtronic model 5076-45 cm lead to the inferior right atrium using a blue J stylet. The lead tip was placed in the region of the right atrial appendage. Fixation screw was deployed, and adequate lead slack was applied and confirmed with orthogonal fluoroscopic views demonstrating lead tip positioned along the anterolateral right atrium. There was no stimulation of the diaphragm with 10 V of output. P waves were sensed at 3.4 mV, slew rate 1.8 V/sec, impedance 514 ohms and threshold of 1.0 V at 0.5 ms. The lead was affixed to the prepectoralis fascia with 2 separate 0 silk ties and tested with gentle traction to confirm stability. The antibiotic-soaked radiopaque gauze was then removed from the pocket. The pocket was flushed with copious amounts of bacitracin. Hemostasis was good. The leads were connected to the pulse generator, Medtronic Advisa DR MRI, with visual confirmation of pins completely through the pin block, set screws tightened, leads tested with gentle traction to confirm stability and reconfirmation of pins completely through the pin block. The leads were then carefully coiled around the posterior aspect of the device and placed in the pocket with the header facing medially. Incision was closed with running layers of 2-0 and 3-0 Vicryl; 4-0 V-Loc was used for the subcuticular layer, and Steri-Strips and sterile dressing were applied. Tegaderms were stretched over a stack of gauze to place some pressure at the incision to  minimize risk of bleeding. The system was viewed under fluoroscopy in AP, LAO and RAO projections to document lead position at the end of the case.  SUMMARY OF THE IMPLANTED HARDWARE:  Pulse generator is a Medtronic Advisa DR  MRI SureScan, model number [id number], serial number [id number]; with right ventricular lead model 5076-52 cm, serial number  [id number]; with right atrial lead Medronic model 5076-45 cm, serial number [id number].  SUMMARY OF PROGRAMMED PARAMETERS: Brady pacing mode is DDD with a lower rate of 60, upper tracking rate of 120 with outflow programmed at 3.5 V at 0.5 ms. Both leads with adaptive Capture Management enabled.   RECOMMENDATIONS:  Bedrest x4 hours. Chest x-ray, PA and lateral, in the morning to evaluate lead positions. Telemetry overnight. Wound check with Dr. Nehemiah Massed in the morning. Instructions to not raise the left arm above the level of the shoulder. No heavy lifting or pulling with the left arm for 1 month. No driving x2 weeks. Keep wound site dry for 1 week. May remove the Steri-Strips at 2 weeks. Instructions were reviewed with the patient and the patient's daughter-in-law, with the daughter-in-law encouraged to assist Gloria Jarvis with activities at home to minimize the risk of lead dislodgment. These recommendations were reviewed with the patient as well. Time allowed for review of questions and questions answered.    ____________________________ Arliss Journey Niamh Rada, MD ddh:dmm D: 11/03/2013 18:27:00 ET T: 11/03/2013 21:42:20 ET JOB#: [id number]  cc: Corey Skains, MD Arliss Journey. Elinor Kleine, MD, <Dictator>  March Rummage MD ELECTRONICALLY SIGNED 12/13/2013 12:05

## 2014-11-17 NOTE — Discharge Summary (Signed)
PATIENT NAME:  Gloria Jarvis, FLOTT MR#:  [id number] DATE OF BIRTH:  June 30, 1929  DATE OF ADMISSION:  01/22/2014 DATE OF DISCHARGE:  01/24/2014  DISCHARGE DIAGNOSES:  1.  Acute systolic congestive heart failure.  2.  Chronic renal insufficiency, stage III.  3.  Hyperlipidemia.  4.  Abnormal weight loss.  5.  History of lung cancer.  6.  Status post pacemaker for sick sinus syndrome.   DISCHARGE MEDICATIONS:  Pravastatin 20 mg at bedtime, omeprazole 20 mg b.i.d., triamterene-HCTZ 25/37.5 mg daily, aspirin 81 mg daily, Norvasc 10 mg daily. Megace 40 mg daily.   REASON FOR ADMISSION:  An 81 year old female presents with shortness of breath and nonexertional chest pain. Please see H and P for HPI, past medical history and physical exam.   HOSPITAL COURSE: The patient was admitted. Cardiac enzymes were slightly elevated due to demand ischemia, no acute coronary syndrome. She was diuresed 2 liters with improvement in her breathing and she came off oxygen. Her chest pain resolved.  Echocardiogram showed LVF of 40%. She has had a reduced appetite of late and has lost some weight. Some of this was thought possibly related to calcium deposit impinging partially on her esophagus. She will go on Megace for the next month, eat a lot of small meals and follow up with Dr. Sabra Heck in 1 month.    ____________________________ Rusty Aus, MD mfm:lt D: 01/24/2014 07:22:40 ET T: 01/24/2014 07:33:16 ET JOB#: [id number]  cc: Rusty Aus, MD, <Dictator> MARK Roselee Culver MD ELECTRONICALLY SIGNED 01/24/2014 8:12

## 2014-11-17 NOTE — Consult Note (Signed)
Present Illness Patient is an 81 year old female with history of partial pneumonectomy, coronary artery disease, hyperlipidemia and hypertension who was admitted with progression   of her chest pain and shortness of breath.  She is status post permanent pacemaker placement.  Chest x-ray on admission did  reveal   heart failure.  She has a mild troponin elevation of 0.24.  EKG reveals a paced rhythm.  She denies current symptoms.  She does have chronic renal insufficiency with serum creatinine of 1.7 and a GFR in the 30s.  She is on triamterene hydrochlorothiazide as well as pravastatin for hyperlipidemia.  Her blood pressure is controlled with amlodipine.  She does not clinically have congestive heart failure at present. she described the chest pain as a heaviness over her chest.  Reviewing her most recent outpatient visit on December 25, 2013 revealed similar symptoms in the office.   Physical Exam:  GEN thin   HEENT PERRL, moist oral mucosa   NECK No masses   RESP normal resp effort  rhonchi   CARD Regular rate and rhythm  Murmur   Murmur Systolic   Systolic Murmur axilla   ABD denies tenderness  normal BS  no Abdominal Bruits   LYMPH negative neck   EXTR negative cyanosis/clubbing, negative edema   SKIN normal to palpation   NEURO cranial nerves intact, motor/sensory function intact   PSYCH A+O to time, place, person   Review of Systems:  Subjective/Chief Complaint Thin Caucasian female in no acute distress   General: No Complaints   Skin: No Complaints   ENT: No Complaints   Eyes: No Complaints   Neck: No Complaints   Respiratory: Short of breath   Cardiovascular: Chest pain or discomfort   Gastrointestinal: No Complaints   Genitourinary: No Complaints   Vascular: No Complaints   Musculoskeletal: No Complaints   Neurologic: No Complaints   Hematologic: No Complaints   Endocrine: No Complaints   Psychiatric: No Complaints   Review of Systems: All other  systems were reviewed and found to be negative   Medications/Allergies Reviewed Medications/Allergies reviewed   Family & Social History:  Family and Social History:  Family History Non-Contributory   Social History positive tobacco (Greater than 1 year)   EKG:  Abnormal NSSTTW changes   Interpretation Intermittent paced rhythm    No Known Allergies:    Impression patient is an 81 year old female with history of coronary disease, and diastolic heart failure, hypertension hyperlipidemia and diabetes.  She was admitted with progressive shortness of breath and chest pain.  Mild troponin elevation.  She does not appear to have and had an acute coronary event than likely this elevated troponin is secondary to increased demand with volume overload in the face of chronic kidney disease at least grade 3. would agree with proceeding with an echocardiogram to determine LV function.  Would defer a functional study at this point and continue to aggressively treat her pulmonary edema guided by echo therapy.  Will closely follow her electrolytes and diurese appropriately.  Will determine at diastolic versus systolic heart failure by echo.   Plan 1. Continue with current medications 2. Review echo when available 3. Careful diuresis following renal function 4. further recommendations after studies are complete.  Consideration for outpatient functional study could be raised pending course.   Electronic Signatures: Teodoro Spray (MD)  (Signed 30-Jun-15 10:59)  Authored: General Aspect/Present Illness, History and Physical Exam, Review of System, Family & Social History, EKG , Allergies, Impression/Plan  Last Updated: 30-Jun-15 10:59 by Teodoro Spray (MD)

## 2014-11-17 NOTE — Discharge Summary (Signed)
PATIENT NAME:  Gloria Jarvis, Gloria Jarvis MR#:  [id number] DATE OF BIRTH:  06/21/1929  DATE OF ADMISSION:  08/15/2013 DATE OF DISCHARGE:  08/17/2013  DISCHARGE DIAGNOSES:  1.  Unstable angina. 2.  Acute on chronic renal failure.  3.  Gastroesophageal reflux disease.  4.  Hypertension.   DISCHARGE MEDICATIONS: Pravastatin 20 mg at bedtime, omeprazole 20 mg b.i.d., hydrochlorothiazide/triamterene 25/37.5 mg daily, aspirin 81 mg daily and amlodipine 10 mg daily.   REASON FOR ADMISSION: An 81 year old female presents with chest pain and abnormal EKG. Please see H and P for HPI, past medical history and physical exam.   HOSPITAL COURSE: The patient was admitted. Cardiac enzymes were slightly abnormal. The patient was asymptomatic from her chest pain, but in light of her symptomatology, she underwent heart catheterization, which showed no occlusive disease. She was asymptomatic. On further discussion, it appeared that this was likely GERD and she will go to dietary changes, b.i.d. omeprazole with low-dose milk of magnesia p.r.n. and she will she will follow up with Dr. Sabra Heck in 1 week.  ____________________________ Rusty Aus, MD mfm:aw D: 08/17/2013 06:25:38 ET T: 08/17/2013 06:42:28 ET JOB#: [id number]  cc: Rusty Aus, MD, <Dictator> [facility] Roselee Culver MD ELECTRONICALLY SIGNED 08/17/2013 7:32

## 2014-11-17 NOTE — Consult Note (Signed)
PATIENT NAME:  Gloria Jarvis, Gloria Jarvis MR#:  [id number] DATE OF BIRTH:  [birth year]  DATE OF CONSULTATION:  08/15/2013  PRIMARY CARE PHYSICIAN:  Emily Filbert, MD CONSULTING PHYSICIAN:  Isaias Cowman, MD  CHIEF COMPLAINT: Chest pain.   HISTORY OF PRESENT ILLNESS: The patient is an 81 year old female with history of hypertension, admitted with new-onset chest pain. The patient reports that she was in her usual state of health until 1 day prior to admission when she experienced chest pain. She awoke on the morning of admission complaining of 9/10 chest discomfort. The patient presented to [facility] Emergency Room where EKG revealed sinus rhythm with right bundle branch block. The patient was admitted to telemetry where she has had borderline elevated troponin of 0.07. Chest pain has resolved.   PAST MEDICAL HISTORY:  1.  Hypertension.  2.  Hyperlipidemia.  3.  Chronic kidney disease.   MEDICATIONS: Pravastatin 20 mg daily, hydrochlorothiazide triamterene 25/37.5, one daily, aspirin 81 mg daily, amlodipine 10 mg daily, omeprazole 20 mg daily.   SOCIAL HISTORY: The patient currently lives alone. She is very active and independent. She quit tobacco abuse in 1989.   FAMILY HISTORY: Father died in his 79s with history of coronary artery disease.   REVIEW OF SYSTEMS:  CONSTITUTIONAL: No fever or chills.  EYES: No blurry vision.  EARS: No hearing loss.  RESPIRATORY: No shortness of breath.  CARDIOVASCULAR: Chest pain as described above.  GASTROINTESTINAL: No nausea, vomiting, or diarrhea.  GENITOURINARY: No dysuria or hematuria.  ENDOCRINE: No polyuria or polydipsia.  MUSCULOSKELETAL: No arthralgias or myalgias.  NEUROLOGICAL: No focal muscle weakness or numbness.  PSYCHOLOGICAL: No depression or anxiety.   PHYSICAL EXAMINATION:  VITAL SIGNS: Blood pressure 149/64, pulse 76, respirations 18, temperature 97.5, pulse oximetry 99%.  HEENT: Pupils equal and reactive to light and accommodation.  NECK:  Supple without thyromegaly.  LUNGS: Clear.  HEART: Normal JVP. Normal PMI. Regular rate and rhythm. Normal S1, S2. Grade 1/6 systolic murmur.  ABDOMEN: Soft and nontender. Pulses were intact bilaterally.  MUSCULOSKELETAL: Normal muscle tone.  NEUROLOGIC: Alert and oriented x3. Motor and sensory both grossly intact.   IMPRESSION: An 81 year old female who presents with prolonged episode of chest pain, has borderline elevated troponin consistent with non-ST-elevation myocardial infarction and/or acute coronary syndrome with right bundle branch block on EKG.   RECOMMENDATIONS:  1.  Agree with current therapy.  2.  Continue heparin drip.  3.  Review 2-D echocardiogram.  4.  Cardiac catheterization with selective coronary arteriography on 08/15/2013. The risks, benefits, and alternatives were explained and informed written consent obtained.   ____________________________ Isaias Cowman, MD ap:np D: 08/15/2013 17:17:58 ET T: 08/15/2013 18:01:44 ET JOB#: [id number]  cc: Isaias Cowman, MD, <Dictator> Isaias Cowman MD ELECTRONICALLY SIGNED 09/12/2013 12:29

## 2014-11-17 NOTE — Op Note (Signed)
PATIENT NAME:  Gloria Jarvis, Gloria Jarvis MR#:  [id number] DATE OF BIRTH:  May 24, 1929  DATE OF PROCEDURE:  05/14/2014  PREOPERATIVE DIAGNOSIS: Displaced left distal radius fracture.   POSTOPERATIVE DIAGNOSIS: Displaced left distal radius fracture.   PROCEDURE PERFORMED: Open reduction internal fixation, left distal radius fracture.   SURGEON: Skip Estimable, MD   ANESTHESIA: General.   ESTIMATED BLOOD LOSS: 40 mL.  FLUIDS REPLACED: 750 mL of crystalloid.   TOURNIQUET TIME: 73 minutes.   DRAINS: None.   IMPLANTS UTILIZED: [employer] DVRA short volar plate, seven 2.5 mm fully threaded pegs, and three 3.5 mm cortical screws.   INDICATIONS FOR SURGERY: The patient is an 81 year old female who sustained a fall on her outstretched left arm and sustained a comminuted displaced left distal radius fracture. Recommendation was made for open reduction internal fixation. After discussion of the risks and benefits of surgical intervention, the patient expressed understanding of the risks and benefits and agreed with plans for surgical intervention.   PROCEDURE IN DETAIL: The patient was brought to the operating room and, after adequate general anesthesia was achieved, a tourniquet was placed on the patient's upper left arm. The patient's left hand and arm were cleaned and prepped with alcohol and DuraPrep and draped in the usual sterile fashion. A "timeout" was performed as per usual protocol. The left upper extremity was exsanguinated using an Esmarch, and the tourniquet was inflated to 250 mmHg. Loupe magnification was used throughout the procedure. A volar longitudinal incision was made in line with the flexor carpi radialis tendon. Dissection was carried down to the tendon and the tendon sheath was incised. The tendon was then reflected in an ulnar fashion so as to protect the median nerve. Dissection was carried down to the pronator quadratus. A L-shaped incision was made and the pronator was elevated off of  the volar surface of the distal radius. The fracture site was identified and soft tissue was removed from the site. The fracture was provisionally reduced and a DVRA short volar plate was placed on the volar surface of the distal radius and provisional fixation was performed using 2 K wires. Acceptable position was noted in AP and lateral planes. A 3.5 mm cortical screw was placed in the slotted position along the proximal portion of the plate. Next, the seven 2.5 mm fully threaded pegs were inserted into the distal fragment with position confirmed using both AP and lateral planes with the FluoroScan. Acceptable position was noted and good position of the pegs appreciated. Finally, the proximal portion of the plate was further secured with 2 additional 3.5 mm cortical screws. Good position was noted in AP, lateral, and PA planes using FluoroScan. The wound was irrigated with copious amounts of normal saline with antibiotic solution. The tourniquet was deflated after total tourniquet time of 73 minutes. Hemostasis was achieved. The wound was closed in layers using first #2-0 Vicryl followed by a running subcuticular suture of #4-0 Vicryl. Steri-Strips were applied. Then 10 mL of 0.25% Marcaine was injected along the incision site. A sterile dressing was applied followed by application of a volar splint.   The patient tolerated the procedure well. She was transported to the recovery room in stable condition. ____________________________ Laurice Record. Holley Bouche., MD jph:sb D: 05/15/2014 06:03:45 ET T: 05/15/2014 07:03:42 ET JOB#: [id number]  cc: Laurice Record. Holley Bouche., MD, <Dictator> JAMES P Holley Bouche MD ELECTRONICALLY SIGNED 05/17/2014 19:50

## 2014-11-17 NOTE — Consult Note (Signed)
PATIENT NAME:  Gloria Jarvis, Gloria Jarvis MR#:  [id number] DATE OF BIRTH:  1929-04-14  DATE OF CONSULTATION:  11/03/2013  REFERRING PHYSICIAN:  Loletha Grayer, MD CONSULTING PHYSICIAN:  Corey Skains, MD  REASON FOR CONSULTATION: Complete heart block with weakness, fatigue and shortness of breath.   CHIEF COMPLAINT: "I'm weak and fatigued."  HISTORY OF PRESENT ILLNESS: This is an 81 year old female with known minimal coronary atherosclerosis by cardiac catheterization earlier this year, but no evidence of significant stenosis and normal LV function by echocardiogram as well as cardiac catheterization having progression of shortness of breath with physical activity relieved by rest. This has been worsening over the last 3 to 4 weeks and culminating in seeing her oncologist today. The patient has had anemia in the past, but currently has no evidence of anemia today. She has had cancer in the past with right lower lobe resection and radiation from esophageal cancer for which is currently stable. The patient has had an EKG today showing normal sinus rhythm with left atrial enlargement and complete heart block with ventricular escape rhythm. The patient is feeling well with relatively higher blood pressure and on no medications that can cause her current issues. The patient has had some chronic kidney disease, which has not changed any recently. The patient is comfortable at this time with no evidence of shortness of breath and chest discomfort. Laboratories are normal. Troponin is normal. There are no further issues.   REVIEW OF SYSTEMS: The remainder of review of systems is negative for vision change, ringing in the ears, hearing loss, cough, congestion, heartburn, nausea, vomiting, diarrhea, bloody stools, stomach pain, extremity pain, leg weakness, cramping of the buttocks, known blood clots, headaches, blackouts, dizzy spells, nosebleeds, congestion, trouble swallowing, frequent urination, urination at night,  muscle weakness, numbness, anxiety, depression, skin lesions or skin rashes.   PAST MEDICAL HISTORY:  1.  Cancer.  2.  Hypertension.   FAMILY HISTORY: No family members with early onset of cardiovascular disease or hypertension.   SOCIAL HISTORY: Currently denies alcohol or tobacco use.   ALLERGIES: As listed.   MEDICATIONS: As listed.   PHYSICAL EXAMINATION: VITAL SIGNS: Blood pressure is 162/68 bilaterally and heart rate is 38 upright and reclining and irregular.  GENERAL: She is a well appearing female in no acute distress.  HEENT: No icterus, thyromegaly, ulcers, hemorrhage, or xanthelasma.  CARDIOVASCULAR: Irregularly irregular with normal S1 and S2. No apparent murmur, gallop, or rub. PMI is diffuse. Carotid upstroke normal without bruit. Jugular venous pressure is normal.  LUNGS: Have few basilar crackles with normal respirations.  ABDOMEN: Soft and nontender without hepatosplenomegaly or masses. Abdominal aorta is normal size without bruit.  EXTREMITIES: 2+ bilateral pulses in dorsal, pedal, radial and femoral arteries without lower extremity edema, cyanosis, clubbing or ulcers.  NEUROLOGIC: She is oriented to time, place, and person with normal mood and affect.   ASSESSMENT: An 81 year old female with minimal coronary atherosclerosis, hypertension with complete heart block, and shortness of breath consistent with chronotropic incompetence needing further treatment options.   RECOMMENDATIONS: The patient will proceed to dual-chamber pacemaker placement for complete heart block and chronotropic incompetence with shortness of breath. She understands the risks and benefits of this including the possibility of death, stroke, heart attack, infection, bleeding, blood clot, pneumothorax, hemopericardium, reactions to medication management and infection. The patient is at low risk for general anesthesia.  ____________________________ Corey Skains, MD bjk:sb D: 11/03/2013 13:42:35  ET T: 11/03/2013 14:41:01 ET JOB#: [id number]  cc: Everlean Cherry.  Nehemiah Massed, MD, <Dictator> Corey Skains MD ELECTRONICALLY SIGNED 11/07/2013 7:50

## 2014-11-17 NOTE — H&P (Signed)
PATIENT NAME:  Gloria Jarvis, Gloria Jarvis MR#:  [id number] DATE OF BIRTH:  1929-07-02  DATE OF ADMISSION:  08/15/2013  PRIMARY CARE PHYSICIAN: Rusty Aus, MD  REFERRING EMERGENCY ROOM PHYSICIAN: Eula Listen, MD  CHIEF COMPLAINT: Chest pain.   HISTORY OF PRESENT ILLNESS: This is an 81 year old female who is very healthy and active, lives independently on her own, drives and manages herself. Today morning, at 7:00 a.m., she woke up suddenly because of severe chest pain which was substernal, central, 9 out of 10, pressure-like and was fluctuating in intensity but was not totally going away. She tried to stay in the bed and tossed and turned and from 1 side to other, hoping the pain will get relieved but within 15 minutes pain did not go away. There was no associated cough, shortness of breath or palpitation. This was the first time she experienced this type of pain so she called her son around 7:20, who lives nearby, and after he came because of severeness of the pain and symptoms he called EMS. EMS arrived over there within a few minutes and gave her nitroglycerin tablet and pain got relieved totally.  Total time pain persisted was around 35 to 40 minutes. On initial work-up in ER, on the EKG there was some new T wave inversion in inferolateral leads and so hospitalist service was contacted for further admission of non-ST-elevation MI.    REVIEW OF SYSTEMS: CONSTITUTIONAL: Negative for fever, fatigue, generalized weakness but had pain in the chest.  EYES: No blurring, double vision, discharge or redness.  EARS, NOSE, THROAT: No tinnitus, ear pain or hearing loss.  RESPIRATORY: No cough, wheezing, hemoptysis or shortness of breath.  CARDIOVASCULAR: The patient had severe chest pain which got relieved with nitroglycerin within 35 to 40 minutes. The pain was 9 out of 10.  ABDOMEN: No nausea, vomiting, diarrhea or abdominal pain.  GENITOURINARY: No dysuria, hematuria or increased frequency.  ENDOCRINE:  No increased frequency. No heat or cold intolerance.  SKIN: No acne or rashes.  MUSCULOSKELETAL: No pain or swelling in the joints.  NEUROLOGICAL: No numbness, weakness, tremor or vertigo.  PSYCHIATRIC: No anxiety, insomnia, bipolar disorder.   PAST MEDICAL HISTORY: 1.  Hypertension.  2.  Hypercholesterolemia.  3.  Chronic renal failure.   PAST SURGICAL HISTORY:  For esophageal cancer, part of esophagus and stomach were removed in 1989 and she received radiation therapy for that. In 2010, she was diagnosed with lung cancer and had part of the lung removed. Had some chemotherapy but did not tolerate it and decided to stop it and has some renal failure secondary to that which is chronic now.   ALLERGIES: None.   SOCIAL HISTORY: She was a smoker, quit in 1989. No alcohol. No illegal drug use. Lives alone, very active in day-to-day life.   FAMILY HISTORY: Mother and father both had coronary artery disease. Mother ended up having a pacemaker in her 12s and father died at 19s with coronary artery disease   HOME MEDICATIONS: 1.  Pravastatin 20 mg oral a day.  2.  Omeprazole 20 mg once a day.  3.  Hydrochlorothiazide and triamterene 25 plus 37.5 mg once a day.  4.  Aspirin 81 mg once a day.  5.  Amlodipine 10 mg oral once a day.   PHYSICAL EXAMINATION: VITAL SIGNS IN ER: Temperature 97.4, pulse 100, respirations 18, blood pressure 170/55 and pulse ox 98% on room air.  GENERAL: The patient is fully alert and oriented to time, place  and person.  HEENT: Head and neck atraumatic. Conjunctivae pink. Oral mucosa moist.  NECK: Supple. No JVD.  RESPIRATORY: Bilateral clear and equal air entry.  CARDIOVASCULAR: S1, S2 present, regular. No murmur.  ABDOMEN: Soft, nontender. Bowel sounds present.  No organomegaly.   SKIN: No rashes.  LEGS: No edema.  NEUROLOGICAL: Power 5 out of 5. Follows commands. No gross abnormality.  PSYCHIATRIC: Does not appear in any acute distress.  JOINTS: No swelling or  tenderness.   LABORATORY, DIAGNOSTIC AND RADIOLOGICAL DATA:   1.  Glucose 98, BUN 30, creatinine 1.6, sodium 142, chloride 111, CO2 22, calcium 9.1  2.  Troponin 0.07.  3.  WBC 6, hemoglobin 10.2, platelet 153, MCV is 94.  4.  INR is 1.  5.  Chest x-ray, PA and lateral, no changes, small bilateral pleural effusion.  6.  EKG there is some T wave inversion in lead 3, lead V3 and V1, and right bundle branch block.     ASSESSMENT AND PLAN: An 81 year old female with past history of hypertension and hyperlipidemia who was a smoker in the past and has family history of coronary artery disease, woke up with severe chest pain, pressure-like, 9 out of 10 this morning which was relieved by nitroglycerin given by EMS, found having some EKG changes and elevated troponin.  1.  Non-ST elevation myocardial infarction. Will treat this as non-ST-elevation myocardial infarction until ruled out by cardiology. Admit to telemetry floor, follow serial troponins and put her on heparin IV drip. Will give aspirin and statin plus beta blocker. The patient would like to go with Dr. Nehemiah Massed ultimately for cardiology so will call [facility] on call for further management of this issue.  2.  Hypertension. She was taking hydrochlorothiazide and triamterene at home but because of coronary artery disease, likelihood will change it to metoprolol this time.  3.  Hyperlipidemia. Was taking pravastatin, we will check lipid panel and continue the same.  4.  Chronic renal failure. This is stable issue and did not change from her previous.  5.  CODE STATUS: Full code.   TOTAL TIME SPENT ON THIS ADMISSION: 50 minutes.     ____________________________ Ceasar Lund Anselm Jungling, MD vgv:cs D: 08/15/2013 13:51:00 ET T: 08/15/2013 14:08:11 ET JOB#: [id number]  cc: Ceasar Lund. Anselm Jungling, MD, <Dictator> Rusty Aus, MD Vaughan Basta MD ELECTRONICALLY SIGNED 08/18/2013 18:54

## 2014-11-17 NOTE — H&P (Signed)
PATIENT NAME:  Gloria, Jarvis MR#:  [id number] DATE OF BIRTH:  January 08, 1929  DATE OF ADMISSION:  11/03/2013  PRIMARY CARE PHYSICIAN: Emily Filbert, MD  ONCOLOGIST: Forest Gleason, MD  REASON FOR ADMISSION: Heart block.   CHIEF COMPLAINT: Chest pain and shortness of breath with exertion going on for over a month.   HISTORY OF PRESENT ILLNESS: This is an 81 year old female who has been ongoing with chest pain and shortness of breath every time she exerts herself and is limited on what she can do. She did have a cardiac cath which was done in January that showed an EF of 63%, proximal RCA 50% stenosis, right PDA 60% stenosis. The patient called up her oncologist because she thought it was anemia again. She went and saw Dr. Oliva Bustard today. Hemoglobin was in the 11 range. The patient was advised to get follow-up appointment for low heart rate and high blood pressure. The patient drove herself to the Emergency Room. In the ER, the patient was found to be in heart block with a heart rate in the 30s. The patient does not take any rate controlling medications. At rest she feels okay, but with any exertion she feels very tired, chest pain and shortness of breath. Hospitalist services were contacted for further evaluation.   PAST MEDICAL HISTORY: Lung cancer status post resection, esophageal cancer status post resection, anemia iron deficiency, chronic kidney disease, hypertension, hyperlipidemia, gastroesophageal reflux disease.   PAST SURGICAL HISTORY: Right lower lobe lung resection, esophageal and partial stomach resection, ovarian hemorrhage requiring 1 ovary to be removed and cataract surgeries.   ALLERGIES: No known drug allergies.   MEDICATIONS: Include amlodipine 10 mg daily, aspirin 81 mg daily, hydrochlorothiazide/triamterene 25/37.5 one tablet daily, omeprazole 20 mg twice a day, pravastatin 20 mg daily.   SOCIAL HISTORY: No smoking. Rare alcohol. No drug use. Lives alone. 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    FAMILY HISTORY: Father died in his 37s of heart disease. Mother died at 34 of a heart-related issue, also did have a pacemaker.   REVIEW OF SYSTEMS: CONSTITUTIONAL: Positive for weakness with exertion. No fever, chills or sweats. Positive for weight gain since her lung resection.  EYES: She does wear glasses.  EARS, NOSE, MOUTH AND THROAT: Positive for runny nose. Occasional dysphagia to solids.  CARDIOVASCULAR: Positive chest pain with exertion only.  RESPIRATORY: Positive for shortness of breath with exertion. Slight cough. No sputum. No hemoptysis.  GASTROINTESTINAL: Positive for nausea and occasional vomiting with her stomach. No abdominal pain. No diarrhea. No constipation. No bright red blood per rectum. No melena.  GENITOURINARY: No burning on urination. No hematuria.  MUSCULOSKELETAL: No joint pain or muscle pain.  INTEGUMENT: No rashes or eruptions.  NEUROLOGIC: No fainting or blackouts.  PSYCHIATRIC: Positive for depression with her chest pain and shortness of breath going on and unable to do anything.  ENDOCRINE: No thyroid problems.  HEMATOLOGIC AND LYMPHATIC: Positive for anemia.   PHYSICAL EXAMINATION: VITAL SIGNS: On presentation to the Emergency Room, pulse 41, respirations 20, blood pressure 168/68, pulse ox 93% on room air. Last vital signs included a pulse of 34, respirations 20, blood pressure 198/85, pulse ox 97% on oxygen.  GENERAL: No respiratory distress, sitting up in bed.  EYES: Conjunctivae and lids normal. Pupils equal, round, and reactive to light. Extraocular muscles intact. No nystagmus.  EARS, NOSE, MOUTH AND THROAT: Tympanic membranes: No erythema. Nasal mucosa: No erythema. Throat: No erythema. No exudate seen. Lips and gums: No lesions.  NECK: No JVD. No bruits. No lymphadenopathy. No thyromegaly. No thyroid nodules palpated.  LUNGS: Clear to auscultation. No use of accessory muscles to breathe. No rhonchi, rales, or wheeze heard.  CARDIOVASCULAR: S1 and  S2 bradycardic. No gallops, rubs, or murmurs heard. Carotid upstroke 1+ bilaterally. Dorsalis pedis pulses 1+ bilaterally. No edema of the lower extremity.  ABDOMEN: Soft, nontender. No organosplenomegaly. Normoactive bowel sounds. No masses felt.  LYMPHATIC: No lymph nodes in the neck.  MUSCULOSKELETAL: No clubbing, edema or cyanosis.  SKIN: No rashes or ulcers seen.  NEUROLOGIC: Cranial nerves II through XII grossly intact. Deep tendon reflexes 1+ bilateral lower extremities.  PSYCHIATRIC: The patient is oriented to person, place, and time.   LABORATORY AND RADIOLOGICAL DATA: INR 1.1.   Chest x-ray: Stable cardiomegaly, mild bilateral pleural effusions.   CPK 45. Magnesium 1.9. Troponin negative.   Urinalysis: Trace leukocyte esterase. White blood cell count 6.9, hemoglobin and H and H 11.1 and 33.5, platelet count 129,000. Glucose 107, BUN 34, creatinine 2.04, sodium 138, potassium 4.2, chloride 107, CO2 22, calcium 8.9, alkaline phosphatase 121, ALT 95, AST 94. Ferritin 250. TIBC 250.   EKG: Looks like Mobitz type II heart block with heart rate in the 30s. The P waves seem regular but each P wave did not have a QRS next to it. Every other beat looked dropped.   ASSESSMENT AND PLAN: 1.  Heart block, likely Mobitz type II with severe bradycardia, symptomatic chest pain and shortness of breath with exertion. I spoke with Dr. Nehemiah Massed to try to schedule a permanent pacemaker for today. We will admit to the CCU with pacer pads until the pacemaker is placed. After the pacemaker is placed likely will be able to downgrade to telemetry. The patient is not on any rate controlling medications at this point.  2.  Acute on chronic kidney disease. We will give gentle fluid at this point.  3.  Malignant hypertension. Rather have the blood pressure a little higher until the pacemaker placed then can start meds at that point.  4.  Iron deficiency anemia. Gastroenterology work-up is scheduled for outpatient  for later in the month.  5.  Hyperlipidemia. On pravastatin.  6.  Gastroesophageal reflux disease. On omeprazole.  7.  Bilateral pleural effusions. She may benefit from Lasix instead of triamterene/hydrochlorothiazide. We will start that in the a.m. 8. Elevated liver function tests. Will continue to monitor and send off hepatitis profiles in the a.m. Could also be secondary to poor perfusion with the low heart rate.  The patient will be admitted to the critical care unit until the pacemaker is placed.   TIME SPENT ON ADMISSION: 55 minutes.   ____________________________ Tana Conch. Leslye Peer, MD rjw:sb D: 11/03/2013 14:05:15 ET T: 11/03/2013 14:27:17 ET JOB#: [id number]  cc: Tana Conch. Leslye Peer, MD, <Dictator> Rusty Aus, MD Martie Lee. Oliva Bustard, MD  Marisue Brooklyn MD ELECTRONICALLY SIGNED 11/05/2013 19:17

## 2014-12-03 ENCOUNTER — Encounter: Payer: Self-pay | Admitting: Family

## 2014-12-03 ENCOUNTER — Ambulatory Visit: Payer: Commercial Managed Care - HMO | Attending: Family | Admitting: Family

## 2014-12-03 VITALS — BP 163/77 | HR 71 | Resp 20 | Ht 63.0 in | Wt 100.0 lb

## 2014-12-03 DIAGNOSIS — I10 Essential (primary) hypertension: Secondary | ICD-10-CM | POA: Diagnosis not present

## 2014-12-03 DIAGNOSIS — R079 Chest pain, unspecified: Secondary | ICD-10-CM | POA: Insufficient documentation

## 2014-12-03 DIAGNOSIS — I509 Heart failure, unspecified: Secondary | ICD-10-CM | POA: Insufficient documentation

## 2014-12-03 DIAGNOSIS — R42 Dizziness and giddiness: Secondary | ICD-10-CM | POA: Diagnosis not present

## 2014-12-03 DIAGNOSIS — I5022 Chronic systolic (congestive) heart failure: Secondary | ICD-10-CM

## 2014-12-03 NOTE — Progress Notes (Signed)
Subjective:    Patient ID: Gloria Jarvis, female    DOB: 1929/04/21, 81 y.o.   MRN: [id number]  Congestive Heart Failure Presents for follow-up visit. Associated symptoms include abdominal pain (lower epigastric area when food gets stuck), chest pain, chest pressure (one week ago, resolved immediately), fatigue and shortness of breath. Pertinent negatives include no orthopnea, palpitations or unexpected weight change. The symptoms have been stable. The patient is experiencing no pain. The pain is present in the epigastric region. The quality of the pain is described as pressure. The pain does not radiate. Chest pain occurs at rest. Past treatments include beta blockers and salt and fluid restriction. The treatment provided moderate relief. Compliance with prior treatments has been good. Her past medical history is significant for anemia and HTN.  Chest Pain  This is a recurrent problem. The current episode started 1 to 4 weeks ago. The onset quality is sudden. The problem occurs intermittently. The problem has been resolved. The pain is present in the epigastric region. The pain is at a severity of 4/10. The pain is moderate. The quality of the pain is described as pressure. The pain does not radiate. Associated symptoms include abdominal pain (lower epigastric area when food gets stuck), back pain, dizziness, malaise/fatigue, nausea, shortness of breath and vomiting (when food gets stuck). Pertinent negatives include no diaphoresis, headaches, irregular heartbeat, lower extremity edema, palpitations or weakness. The pain is aggravated by nothing. She has tried nothing for the symptoms. Risk factors include being elderly and post-menopausal.  Her past medical history is significant for cancer, CHF, hyperlipidemia, HTN and hypertension.  Pertinent negatives for past medical history include no COPD and no thyroid problem.  Dizziness This is a chronic problem. The current episode started more than 1 year  ago. The problem occurs intermittently. The problem has been unchanged. Associated symptoms include abdominal pain (lower epigastric area when food gets stuck), chest pain, fatigue, nausea and vomiting (when food gets stuck). Pertinent negatives include no diaphoresis, headaches, neck pain, rash, sore throat or weakness. The symptoms are aggravated by standing and bending. She has tried eating and position changes for the symptoms. The treatment provided mild relief.      Review of Systems  Constitutional: Positive for malaise/fatigue, appetite change (decreased appetite due to difficulty swallowing) and fatigue. Negative for diaphoresis and unexpected weight change.  HENT: Positive for trouble swallowing. Negative for sore throat.   Eyes: Negative.   Respiratory: Positive for choking (at times with certain foods) and shortness of breath.   Cardiovascular: Positive for chest pain. Negative for palpitations and leg swelling.  Gastrointestinal: Positive for nausea, vomiting (when food gets stuck) and abdominal pain (lower epigastric area when food gets stuck).  Endocrine: Negative.   Genitourinary: Negative.   Musculoskeletal: Positive for back pain. Negative for neck pain.  Skin: Negative for color change and rash.  Neurological: Positive for dizziness. Negative for weakness and headaches.  Hematological: Negative for adenopathy. Bruises/bleeds easily.  Psychiatric/Behavioral: Negative.        Objective:   Physical Exam  Constitutional: She is oriented to person, place, and time. She appears well-developed. No distress.  HENT:  Head: Normocephalic and atraumatic.  Eyes: Conjunctivae are normal. Pupils are equal, round, and reactive to light.  Neck: Normal range of motion. Neck supple.  Cardiovascular: Normal rate and normal heart sounds.   Pulmonary/Chest: Effort normal and breath sounds normal. She has no rales.  Abdominal: Soft. There is no tenderness.  Musculoskeletal: She exhibits  no edema or tenderness.  Neurological: She is alert and oriented to person, place, and time.  Skin: Skin is warm and dry.  Psychiatric: She has a normal mood and affect. Her behavior is normal.  Nursing note and vitals reviewed.         Assessment & Plan:  1: Chronic heart failure with reduced ejection fraction- Patient notes an improvement in her back pain since stopping entresto so will keep her off of it at this time. She continues to weigh herself and has lost an additional 4 pounds since she was here last. She says that she has difficulty swallowing foods at times so is afraid to eat. She was reminded to call for an overnight weight gain of >2 pounds or a weekly weight gain of >5 pounds. She is not adding any salt to her foods. She feels like she is drinking plenty of water. 2: HTN- Blood pressure elevated today but she says that she hasn't taken her medications yet today because she hasn't eaten anything yet. Will continue to monitor. 3: Dizziness- She says that she's figured out that she tends to get dizzy when her blood sugar drops so she's trying to make sure that she keeps sugar tablets close by. Still encouraged her to change positions slowly. She says that she hasn't fallen "lately". 4: Chest pain- She says that she experienced an episode of chest pain last week that was mid-epigastric but was sharper than normal. She says that it came and went immediately. She does have dysphagia where food gets stuck but she says that this was different. No pain since that episode and no associated symptoms along with this. Encouraged her to speak with her cardiologist in this regard especially if it occurs again.   Return in 3 months or sooner if needed.

## 2014-12-03 NOTE — Patient Instructions (Signed)
Continue weighing daily and call for an overnight weight gain of > 2 pounds or a weekly weight gain of >5 pounds. 

## 2014-12-31 ENCOUNTER — Other Ambulatory Visit: Payer: Self-pay | Admitting: Family

## 2015-03-05 ENCOUNTER — Ambulatory Visit: Payer: Commercial Managed Care - HMO | Admitting: Family

## 2015-03-14 ENCOUNTER — Ambulatory Visit: Payer: Commercial Managed Care - HMO | Attending: Family | Admitting: Family

## 2015-03-14 ENCOUNTER — Encounter: Payer: Self-pay | Admitting: Family

## 2015-03-14 VITALS — BP 130/54 | HR 75 | Resp 20 | Ht 65.0 in | Wt 99.0 lb

## 2015-03-14 DIAGNOSIS — Z87891 Personal history of nicotine dependence: Secondary | ICD-10-CM | POA: Diagnosis not present

## 2015-03-14 DIAGNOSIS — I5022 Chronic systolic (congestive) heart failure: Secondary | ICD-10-CM | POA: Insufficient documentation

## 2015-03-14 DIAGNOSIS — R1310 Dysphagia, unspecified: Secondary | ICD-10-CM | POA: Diagnosis not present

## 2015-03-14 DIAGNOSIS — Z79899 Other long term (current) drug therapy: Secondary | ICD-10-CM | POA: Insufficient documentation

## 2015-03-14 DIAGNOSIS — I10 Essential (primary) hypertension: Secondary | ICD-10-CM | POA: Diagnosis not present

## 2015-03-14 DIAGNOSIS — E785 Hyperlipidemia, unspecified: Secondary | ICD-10-CM | POA: Insufficient documentation

## 2015-03-14 DIAGNOSIS — Z8501 Personal history of malignant neoplasm of esophagus: Secondary | ICD-10-CM | POA: Diagnosis not present

## 2015-03-14 DIAGNOSIS — K219 Gastro-esophageal reflux disease without esophagitis: Secondary | ICD-10-CM | POA: Diagnosis not present

## 2015-03-14 NOTE — Patient Instructions (Addendum)
Continue weighing daily and call for an overnight weight gain of > 2 pounds or a weekly weight gain of >5 pounds. 

## 2015-03-14 NOTE — Progress Notes (Signed)
Subjective:    Patient ID: Gloria Jarvis, female    DOB: [birth year], 81 y.o.   MRN: [id number]  Congestive Heart Failure Presents for follow-up visit. The disease course has been stable. Associated symptoms include fatigue and shortness of breath. Pertinent negatives include no abdominal pain, chest pain, edema, orthopnea or palpitations. The symptoms have been stable. Past treatments include beta blockers and salt and fluid restriction. The treatment provided moderate relief. Compliance with prior treatments has been good. Her past medical history is significant for HTN. Compliance with total regimen is 76-100%.  Hypertension This is a chronic problem. The current episode started more than 1 year ago. The problem is unchanged. The problem is controlled. Associated symptoms include malaise/fatigue and shortness of breath. Pertinent negatives include no chest pain, headaches, neck pain, palpitations, peripheral edema or PND. There are no associated agents to hypertension. Risk factors for coronary artery disease include dyslipidemia and post-menopausal state. Past treatments include beta blockers and ACE inhibitors. The current treatment provides significant improvement. There are no compliance problems.  Hypertensive end-organ damage includes heart failure.   Past Medical History  Diagnosis Date  . Lung cancer   . Esophageal cancer   . Hyperlipidemia   . Hypertension   . Syncope and collapse   . Renal insufficiency   . Arthritis   . GERD (gastroesophageal reflux disease)   . Anemia   . Anxiety   . Depressed    Past Surgical History  Procedure Laterality Date  . Insert / replace / remove pacemaker    . Carotid endarterectomy    . Cardiac catheterization    . Wrist fracture surgery    . Partial hysterectomy    . Esophageal dilation      Family History  Problem Relation Age of Onset  . Heart disease Mother   . Heart disease Father   . Stroke Father   . COPD Sister     Social  History  Substance Use Topics  . Smoking status: Former Smoker -- 1.00 packs/day for 40 years    Types: Cigarettes    Quit date: 07/28/1987  . Smokeless tobacco: Never Used  . Alcohol Use: No    Allergies  Allergen Reactions  . Ace Inhibitors Cough  . Amlodipine Diarrhea and Nausea And Vomiting    Prior to Admission medications   Medication Sig Start Date End Date Taking? Authorizing Provider  acetaminophen (TYLENOL) 500 MG tablet Take 500 mg by mouth every 6 (six) hours as needed for headache.   Yes Historical Provider, MD  aspirin EC 81 MG tablet Take 81 mg by mouth daily.   Yes Historical Provider, MD  carvedilol (COREG) 12.5 MG tablet take 1 tablet by mouth twice a day 12/31/14  Yes Alisa Graff, FNP  citalopram (CELEXA) 20 MG tablet Take 20 mg by mouth daily.   Yes Historical Provider, MD  omeprazole (PRILOSEC) 20 MG capsule Take 20 mg by mouth 2 (two) times daily before a meal.   Yes Historical Provider, MD  pravastatin (PRAVACHOL) 20 MG tablet Take 20 mg by mouth daily.   Yes Historical Provider, MD      Review of Systems  Constitutional: Positive for malaise/fatigue and fatigue. Negative for appetite change.  HENT: Positive for rhinorrhea and trouble swallowing (due to dysphagia). Negative for congestion and sore throat.   Eyes: Negative.   Respiratory: Positive for cough (intermittently), choking, shortness of breath and wheezing. Negative for chest tightness.   Cardiovascular: Negative for chest pain,  palpitations, leg swelling and PND.  Gastrointestinal: Positive for nausea (due to food getting stuck). Negative for abdominal pain and abdominal distention.  Endocrine: Negative.   Genitourinary: Negative.   Musculoskeletal: Negative for back pain and neck pain.  Skin: Negative.   Allergic/Immunologic: Negative.   Neurological: Negative for dizziness, light-headedness and headaches.  Hematological: Negative for adenopathy. Bruises/bleeds easily.   Psychiatric/Behavioral: Positive for dysphoric mood. Negative for sleep disturbance (sleeping on  2 pillows with head of bed elevated). The patient is not nervous/anxious.        Objective:   Physical Exam  Constitutional: She is oriented to person, place, and time. She appears well-developed. She appears cachectic.  HENT:  Head: Normocephalic and atraumatic.  Eyes: Conjunctivae are normal. Pupils are equal, round, and reactive to light.  Neck: Normal range of motion. Neck supple.  Cardiovascular: Normal rate and regular rhythm.   Pulmonary/Chest: Effort normal. No respiratory distress. She has no wheezes. She has no rales.  Abdominal: Soft. She exhibits no distension. There is no tenderness.  Musculoskeletal: She exhibits no edema or tenderness.  Neurological: She is alert and oriented to person, place, and time.  Skin: Skin is warm and dry.  Psychiatric: She has a normal mood and affect. Her behavior is normal. Thought content normal.  Nursing note and vitals reviewed.   BP 130/54 mmHg  Pulse 75  Resp 20  Ht '5\' 5"'$  (1.651 m)  Wt 99 lb (44.906 kg)  BMI 16.47 kg/m2  SpO2 100%  LMP  (LMP Unknown)       Assessment & Plan:  1: Chronic heart failure with reduced ejection fraction- Patient presents with some shortness of breath and fatigue upon exertion. She says that she was able to walk in here today without difficulty. When she does experience symptoms, she will sit down and rest until she recovers. No edema in her legs/feet. She has lost another pound since she was here last but continues to weigh herself daily. Reminded to call for an overnight weight gain of >2 pounds or a weekly weight gain of >5 pounds. She is not adding any salt to her food. She has recently started going to the gym and is walking on a treadmill for 15 minutes at a time. She is allergic to ACE-i and she had some back pain with entresto so will leave medications the same at this time. 2: HTN- Blood pressure  looks good today. 3: Dysphagia- Patient continues to have difficulty in swallowing her food and says that sometimes it gets stuck in her throat and it causes her to vomit on occasion. She follows closely with GI regarding this.   Return in 3 months or sooner for any questions/problems before then.

## 2015-06-14 ENCOUNTER — Encounter: Payer: Self-pay | Admitting: Family

## 2015-06-14 ENCOUNTER — Ambulatory Visit: Payer: Commercial Managed Care - HMO | Attending: Family | Admitting: Family

## 2015-06-14 VITALS — BP 193/55 | HR 75 | Resp 20 | Ht 65.0 in | Wt 98.0 lb

## 2015-06-14 DIAGNOSIS — F419 Anxiety disorder, unspecified: Secondary | ICD-10-CM | POA: Insufficient documentation

## 2015-06-14 DIAGNOSIS — I10 Essential (primary) hypertension: Secondary | ICD-10-CM | POA: Diagnosis not present

## 2015-06-14 DIAGNOSIS — Z87891 Personal history of nicotine dependence: Secondary | ICD-10-CM | POA: Insufficient documentation

## 2015-06-14 DIAGNOSIS — Z7982 Long term (current) use of aspirin: Secondary | ICD-10-CM | POA: Diagnosis not present

## 2015-06-14 DIAGNOSIS — Z79899 Other long term (current) drug therapy: Secondary | ICD-10-CM | POA: Insufficient documentation

## 2015-06-14 DIAGNOSIS — K219 Gastro-esophageal reflux disease without esophagitis: Secondary | ICD-10-CM | POA: Diagnosis not present

## 2015-06-14 DIAGNOSIS — I5022 Chronic systolic (congestive) heart failure: Secondary | ICD-10-CM

## 2015-06-14 DIAGNOSIS — E875 Hyperkalemia: Secondary | ICD-10-CM | POA: Insufficient documentation

## 2015-06-14 DIAGNOSIS — E785 Hyperlipidemia, unspecified: Secondary | ICD-10-CM | POA: Diagnosis not present

## 2015-06-14 DIAGNOSIS — R0602 Shortness of breath: Secondary | ICD-10-CM | POA: Insufficient documentation

## 2015-06-14 MED ORDER — FUROSEMIDE 20 MG PO TABS: 20.0000 mg | ORAL_TABLET | Freq: Every day | ORAL | Status: DC

## 2015-06-14 NOTE — Progress Notes (Signed)
Subjective:    Patient ID: Gloria Jarvis, female    DOB: 10/18/1928, 81 y.o.   MRN: [id number]  Congestive Heart Failure Presents for follow-up visit. The disease course has been improving. Associated symptoms include fatigue and shortness of breath. Pertinent negatives include no abdominal pain, chest pain, edema, orthopnea or palpitations. The symptoms have been stable. Past treatments include beta blockers and salt and fluid restriction. The treatment provided moderate relief. Compliance with prior treatments has been good. Her past medical history is significant for HTN. She has multiple 1st degree relatives with heart disease. Compliance with total regimen is 76-100%.  Hypertension This is a chronic problem. The current episode started more than 1 year ago. The problem has been gradually worsening since onset. The problem is uncontrolled. Associated symptoms include shortness of breath. Pertinent negatives include no chest pain, headaches, neck pain or palpitations. Risk factors for coronary artery disease include dyslipidemia, post-menopausal state and stress. Past treatments include ACE inhibitors, angiotensin blockers, beta blockers, diuretics and lifestyle changes. The current treatment provides mild improvement. Hypertensive end-organ damage includes heart failure.   Past Medical History  Diagnosis Date  . Lung cancer ([place])   . Esophageal cancer ([place])   . Hyperlipidemia   . Hypertension   . Syncope and collapse   . Renal insufficiency   . Arthritis   . GERD (gastroesophageal reflux disease)   . Anemia   . Anxiety   . Depressed     Past Surgical History  Procedure Laterality Date  . Insert / replace / remove pacemaker    . Carotid endarterectomy    . Cardiac catheterization    . Wrist fracture surgery    . Partial hysterectomy    . Esophageal dilation      Family History  Problem Relation Age of Onset  . Heart disease Mother   . Heart disease Father   . Stroke Father    . COPD Sister     Social History  Substance Use Topics  . Smoking status: Former Smoker -- 1.00 packs/day for 40 years    Types: Cigarettes    Quit date: 07/28/1987  . Smokeless tobacco: Never Used  . Alcohol Use: No    Allergies  Allergen Reactions  . Ace Inhibitors Cough  . Amlodipine Diarrhea and Nausea And Vomiting  . Entresto [Sacubitril-Valsartan] Other (See Comments)    Back pain    Prior to Admission medications   Medication Sig Start Date End Date Taking? Authorizing Provider  acetaminophen (TYLENOL) 500 MG tablet Take 500 mg by mouth every 6 (six) hours as needed for headache.   Yes Historical Provider, MD  aspirin EC 81 MG tablet Take 81 mg by mouth daily.   Yes Historical Provider, MD  carvedilol (COREG) 12.5 MG tablet take 1 tablet by mouth twice a day 12/31/14  Yes Alisa Graff, FNP  omeprazole (PRILOSEC) 20 MG capsule Take 20 mg by mouth 2 (two) times daily before a meal.   Yes Historical Provider, MD  furosemide (LASIX) 20 MG tablet Take 1 tablet (20 mg total) by mouth daily. 06/14/15   Alisa Graff, FNP  pravastatin (PRAVACHOL) 20 MG tablet Take 20 mg by mouth daily.    Historical Provider, MD      Review of Systems  Constitutional: Positive for fatigue. Negative for appetite change.  HENT: Positive for postnasal drip. Negative for congestion and sore throat.   Eyes: Negative.   Respiratory: Positive for shortness of breath. Negative for chest  tightness.   Cardiovascular: Negative for chest pain, palpitations and leg swelling.  Gastrointestinal: Negative for abdominal pain and abdominal distention.  Endocrine: Negative.   Genitourinary: Negative.   Musculoskeletal: Positive for back pain (on occasion). Negative for neck pain.  Skin: Negative.   Allergic/Immunologic: Negative.   Neurological: Positive for light-headedness (if change position too quickly). Negative for dizziness and headaches.  Hematological: Negative for adenopathy. Bruises/bleeds  easily.  Psychiatric/Behavioral: Positive for dysphoric mood. Negative for sleep disturbance (sleeping on 2 pillows). The patient is not nervous/anxious.        Objective:   Physical Exam  Constitutional: She is oriented to person, place, and time. She appears well-developed and well-nourished.  HENT:  Head: Normocephalic and atraumatic.  Eyes: Conjunctivae are normal. Pupils are equal, round, and reactive to light.  Neck: Normal range of motion. Neck supple.  Cardiovascular: Normal rate and regular rhythm.   Pulmonary/Chest: Effort normal. She has no wheezes. She has no rales.  Abdominal: Soft. She exhibits no distension. There is no tenderness.  Musculoskeletal: She exhibits no edema or tenderness.  Neurological: She is alert and oriented to person, place, and time.  Skin: Skin is warm and dry.  Psychiatric: She has a normal mood and affect. Her behavior is normal. Thought content normal.  Nursing note and vitals reviewed.   BP 193/55 mmHg  Pulse 75  Resp 20  Ht '5\' 5"'$  (1.651 m)  Wt 98 lb (44.453 kg)  BMI 16.31 kg/m2  SpO2 100%  LMP  (LMP Unknown)       Assessment & Plan:  1: Chronic heart failure with reduced ejection fraction- Patient presents with fatigue and shortness of breath with exertion. When she does experience symptoms, she will sit down and rest for a few minutes until her breathing improves which she says happens fairly quickly. Denies any swelling in her legs. Continues to weigh herself and reports a stable weight. Reminded to call for an overnight weight gain of >2 pounds or a weekly weight gain of >5 pounds. She is not adding any salt to her food and tries to follow a low sodium diet. Has received her flu vaccine this year. Was unable to tolerate entresto due to back pain and ACE-i make her cough.  2: HTN- Blood pressure elevated and it's been elevated in the past. She is no longer taking her diuretic and she's unsure of why other than she's been told that her  "kidney's are acting up". Discussed how a diuretic aids in blood pressure reduction so will start furosemide '20mg'$  daily. Will check a basic metabolic panel on her return visit. 3: Hyperkalemia- Last potassium level was 5.6 on 05/28/15 so adding the diuretic should help lower that. Also discussed foods that were high in potassium.   Return in 2 weeks or sooner for any questions/problems before then.

## 2015-06-14 NOTE — Patient Instructions (Addendum)
Continue weighing daily and call for an overnight weight gain of > 2 pounds or a weekly weight gain of >5 pounds.  Begin furosemide '20mg'$  daily.

## 2015-06-18 ENCOUNTER — Telehealth: Payer: Self-pay

## 2015-06-18 NOTE — Telephone Encounter (Signed)
Gloria Jarvis called and left a message on the machine, wanting to speak with the provider regarding her furosemide. She states that she was told by another provider not to take the furosemide unless she has swelling due to her kidney function. She says that she doesn't have any swelling, so she isnt sure what she should do, and would like to discuss it.  Please contact patient.

## 2015-06-18 NOTE — Telephone Encounter (Signed)
When patient was last here, we discussed adding '20mg'$  furosemide to her regimen to aid in decreasing her blood pressure as well as her potassium level. She was concerned because of her history of renal dysfunction and she says that her mother died from kidney problems so she was concerned about adding a fluid pill because her PCP had told her that unless she had swelling, she didn't need to take it. She says that her neighbor checked her blood pressure yesterday and it was still in the 160's. Explained to patient that an elevated blood pressure is harmful to the kidneys and we also didn't want her potassium continuing to rise. Explained how the diuretic would work and that we were planning on checking lab work at her next appointment with [place] on 07/02/15. Offered for patient to take 1/2 tablet, which would be '10mg'$ , daily if she would be more comfortable in doing so. She said she would be much comfortable taking just a 1/2 tablet daily for now until we are able to check her lab work.

## 2015-06-24 ENCOUNTER — Telehealth: Payer: Self-pay | Admitting: Family

## 2015-06-24 NOTE — Telephone Encounter (Signed)
Patient called saying that she does not want to take the furosemide anymore. She took one dose and started to not feel well. She felt like her blood sugar dropped greatly as she was off-balance, sweaty and had blurry vision. She does not have a way to check her glucose but says that her blood sugar has dropped low in the past but has never happened this quickly. She ate some glucose tablets and she felt like it improved and she felt better. She called to say that she was very leery now of taking anymore furosemide. Told her to not take them anymore and she will keep her next appointment already scheduled with [place] so that we can recheck her blood pressure. Patient is comfortable with this plan.

## 2015-07-02 ENCOUNTER — Ambulatory Visit: Payer: Commercial Managed Care - HMO | Attending: Family | Admitting: Family

## 2015-07-02 ENCOUNTER — Encounter: Payer: Self-pay | Admitting: Family

## 2015-07-02 VITALS — BP 160/60 | HR 80 | Resp 18 | Ht 63.0 in | Wt 96.0 lb

## 2015-07-02 DIAGNOSIS — Z87891 Personal history of nicotine dependence: Secondary | ICD-10-CM | POA: Diagnosis not present

## 2015-07-02 DIAGNOSIS — F419 Anxiety disorder, unspecified: Secondary | ICD-10-CM | POA: Insufficient documentation

## 2015-07-02 DIAGNOSIS — R1310 Dysphagia, unspecified: Secondary | ICD-10-CM | POA: Insufficient documentation

## 2015-07-02 DIAGNOSIS — K219 Gastro-esophageal reflux disease without esophagitis: Secondary | ICD-10-CM | POA: Insufficient documentation

## 2015-07-02 DIAGNOSIS — E785 Hyperlipidemia, unspecified: Secondary | ICD-10-CM | POA: Diagnosis not present

## 2015-07-02 DIAGNOSIS — Z8501 Personal history of malignant neoplasm of esophagus: Secondary | ICD-10-CM | POA: Insufficient documentation

## 2015-07-02 DIAGNOSIS — Z79899 Other long term (current) drug therapy: Secondary | ICD-10-CM | POA: Insufficient documentation

## 2015-07-02 DIAGNOSIS — I10 Essential (primary) hypertension: Secondary | ICD-10-CM | POA: Insufficient documentation

## 2015-07-02 DIAGNOSIS — D649 Anemia, unspecified: Secondary | ICD-10-CM | POA: Insufficient documentation

## 2015-07-02 DIAGNOSIS — Z7982 Long term (current) use of aspirin: Secondary | ICD-10-CM | POA: Insufficient documentation

## 2015-07-02 DIAGNOSIS — I5022 Chronic systolic (congestive) heart failure: Secondary | ICD-10-CM | POA: Diagnosis not present

## 2015-07-02 DIAGNOSIS — R0602 Shortness of breath: Secondary | ICD-10-CM | POA: Insufficient documentation

## 2015-07-02 NOTE — Patient Instructions (Signed)
Continue weighing daily and call for an overnight weight gain of > 2 pounds or a weekly weight gain of >5 pounds. 

## 2015-07-02 NOTE — Progress Notes (Signed)
Subjective:    Patient ID: Gloria Jarvis, female    DOB: 1928/11/13, 81 y.o.   MRN: [id number]  Congestive Heart Failure Presents for follow-up visit. The disease course has been stable. Associated symptoms include fatigue, palpitations (rarely) and shortness of breath. Pertinent negatives include no abdominal pain, chest pain, chest pressure, edema or orthopnea. The symptoms have been stable. Past treatments include beta blockers and salt and fluid restriction. The treatment provided moderate relief. Compliance with prior treatments has been good. Her past medical history is significant for anemia and HTN. There is no history of chronic lung disease or CVA. She has one 1st degree relative with heart disease. Compliance with total regimen is 76-100%.  Hypertension This is a chronic problem. The current episode started more than 1 year ago. The problem is unchanged. The problem is uncontrolled. Associated symptoms include palpitations (rarely) and shortness of breath. Pertinent negatives include no blurred vision, chest pain, headaches, neck pain or peripheral edema. There are no associated agents to hypertension. Risk factors for coronary artery disease include post-menopausal state and dyslipidemia. Past treatments include ACE inhibitors, angiotensin blockers, beta blockers, calcium channel blockers, diuretics and lifestyle changes. The current treatment provides mild improvement. Compliance problems include medication side effects.  Hypertensive end-organ damage includes kidney disease, CAD/MI and heart failure.    Past Medical History  Diagnosis Date  . Lung cancer ([place])   . Esophageal cancer ([place])   . Hyperlipidemia   . Hypertension   . Syncope and collapse   . Renal insufficiency   . Arthritis   . GERD (gastroesophageal reflux disease)   . Anemia   . Anxiety   . Depressed     Past Surgical History  Procedure Laterality Date  . Insert / replace / remove pacemaker    . Carotid  endarterectomy    . Cardiac catheterization    . Wrist fracture surgery    . Partial hysterectomy    . Esophageal dilation      Family History  Problem Relation Age of Onset  . Heart disease Mother   . Heart disease Father   . Stroke Father   . COPD Sister     Social History  Substance Use Topics  . Smoking status: Former Smoker -- 1.00 packs/day for 40 years    Types: Cigarettes    Quit date: 07/28/1987  . Smokeless tobacco: Never Used  . Alcohol Use: No    Allergies  Allergen Reactions  . Ace Inhibitors Cough  . Amlodipine Diarrhea and Nausea And Vomiting  . Entresto [Sacubitril-Valsartan] Other (See Comments)    Back pain  . Furosemide Other (See Comments)    Blood sugar dropped    Prior to Admission medications   Medication Sig Start Date End Date Taking? Authorizing Provider  acetaminophen (TYLENOL) 500 MG tablet Take 500 mg by mouth every 6 (six) hours as needed for headache.   Yes Historical Provider, MD  aspirin EC 81 MG tablet Take 81 mg by mouth daily.   Yes Historical Provider, MD  carvedilol (COREG) 12.5 MG tablet take 1 tablet by mouth twice a day 12/31/14  Yes Alisa Graff, FNP  omeprazole (PRILOSEC) 20 MG capsule Take 20 mg by mouth 2 (two) times daily before a meal.   Yes Historical Provider, MD  pravastatin (PRAVACHOL) 20 MG tablet Take 20 mg by mouth daily.   Yes Historical Provider, MD     Review of Systems  Constitutional: Positive for fatigue. Negative for appetite change.  HENT: Positive for rhinorrhea and trouble swallowing (chronic in nature). Negative for congestion and sore throat.   Eyes: Negative.  Negative for blurred vision.  Respiratory: Positive for shortness of breath. Negative for cough and chest tightness.   Cardiovascular: Positive for palpitations (rarely). Negative for chest pain and leg swelling.  Gastrointestinal: Negative for abdominal pain and abdominal distention.  Endocrine: Negative.   Genitourinary: Negative.    Musculoskeletal: Positive for back pain. Negative for neck pain.  Skin: Negative.   Allergic/Immunologic: Negative.   Neurological: Positive for light-headedness. Negative for dizziness and headaches.  Hematological: Negative for adenopathy. Bruises/bleeds easily.  Psychiatric/Behavioral: Positive for dysphoric mood. Negative for sleep disturbance (sleeping on 2 pillows). The patient is not nervous/anxious.        Objective:   Physical Exam  Constitutional: She is oriented to person, place, and time. She appears well-developed.  HENT:  Head: Normocephalic and atraumatic.  Eyes: Conjunctivae are normal. Pupils are equal, round, and reactive to light.  Neck: Normal range of motion. Neck supple.  Cardiovascular: Normal rate and regular rhythm.   Pulmonary/Chest: Effort normal. She has no wheezes. She has no rales.  Abdominal: Soft. She exhibits no distension. There is no tenderness.  Musculoskeletal: She exhibits no edema or tenderness.  Neurological: She is alert and oriented to person, place, and time.  Skin: Skin is warm and dry.  Psychiatric: She has a normal mood and affect. Her behavior is normal. Thought content normal.  Nursing note and vitals reviewed.   BP 160/60 mmHg  Pulse 80  Resp 18  Ht '5\' 3"'$  (1.6 m)  Wt 96 lb (43.545 kg)  BMI 17.01 kg/m2  SpO2 96%  LMP  (LMP Unknown)       Assessment & Plan:  1: Chronic heart failure with reduced ejection fraction- Patient presents with some fatigue and shortness of breath with exertion. She says that when she does get tired or short of breath, she will stop what she's doing to rest until her symptoms improve which occurs fairly quickly. She continues to weigh herself and says that her weight has been stable. By our scale, she's lost 2 pounds in the last couple of weeks. She tries to watch her sodium content but admits that she doesn't always eat low sodium choices. She has had side effects with ace-i and entresto so will leave  medications as is. [facility] Pharm[facility] went in and reviewed medications with the patient.  2: HTN- Blood pressure slightly better when rechecked. She says that she took her medication about an hour prior to coming to the office. Tried using furosemide but she says that her blood sugar dropped and she felt bad and didn't take anymore. Have added furosemide to her allergy list today. Encouraged her to check it at the pharmacy when she picks up her medication so that she can keep an eye on it. 3: Dysphagia- This is a chronic problem and she's learned to adapt to it by chewing slowly and limiting foods that tend to get stuck. Has lost a couple of pounds. PCP said that he would be sending her back to GI for a followup.  Return here in 3 months or sooner for any questions/problems before then.

## 2015-07-07 ENCOUNTER — Other Ambulatory Visit: Payer: Self-pay | Admitting: Family

## 2015-08-08 ENCOUNTER — Other Ambulatory Visit: Payer: Self-pay | Admitting: Gastroenterology

## 2015-08-08 DIAGNOSIS — R1013 Epigastric pain: Secondary | ICD-10-CM

## 2015-08-14 ENCOUNTER — Ambulatory Visit
Admission: RE | Admit: 2015-08-14 | Discharge: 2015-08-14 | Disposition: A | Discharge status: Home / Self Care | Payer: Commercial Managed Care - HMO | Source: Ambulatory Visit | Attending: Gastroenterology | Admitting: Gastroenterology

## 2015-08-14 DIAGNOSIS — R1013 Epigastric pain: Secondary | ICD-10-CM | POA: Diagnosis present

## 2015-08-14 DIAGNOSIS — Z9049 Acquired absence of other specified parts of digestive tract: Secondary | ICD-10-CM | POA: Diagnosis not present

## 2015-08-23 ENCOUNTER — Other Ambulatory Visit: Payer: Self-pay | Admitting: Internal Medicine

## 2015-08-23 DIAGNOSIS — N184 Chronic kidney disease, stage 4 (severe): Secondary | ICD-10-CM

## 2015-08-29 ENCOUNTER — Ambulatory Visit
Admission: RE | Admit: 2015-08-29 | Discharge: 2015-08-29 | Disposition: A | Discharge status: Home / Self Care | Payer: Commercial Managed Care - HMO | Source: Ambulatory Visit | Attending: Internal Medicine | Admitting: Internal Medicine

## 2015-08-29 DIAGNOSIS — N184 Chronic kidney disease, stage 4 (severe): Secondary | ICD-10-CM | POA: Insufficient documentation

## 2015-08-29 DIAGNOSIS — J90 Pleural effusion, not elsewhere classified: Secondary | ICD-10-CM | POA: Diagnosis not present

## 2015-08-29 DIAGNOSIS — I509 Heart failure, unspecified: Secondary | ICD-10-CM | POA: Diagnosis not present

## 2015-09-30 ENCOUNTER — Ambulatory Visit: Payer: Commercial Managed Care - HMO | Admitting: Family

## 2015-10-23 ENCOUNTER — Other Ambulatory Visit: Payer: Self-pay | Admitting: Internal Medicine

## 2015-10-23 DIAGNOSIS — K7689 Other specified diseases of liver: Secondary | ICD-10-CM

## 2015-10-24 ENCOUNTER — Telehealth: Payer: Self-pay | Admitting: Family

## 2015-10-24 NOTE — Telephone Encounter (Signed)
Returned message left by the patient. She says that she's been having some swelling in her legs and that they are now weeping fluids. She is being followed closely by her PCP regarding this and is currently scheduled for a couple of tests on her heart but she's unsure of what exactly they are. She did not come to her last appointment because she had the flu and she doesn't want to make another appointment at this time until this testing is completed. She says that she will keep [place] informed and just called to up date [place] on what's been going on with her. Thanked her for calling and asked her to keep [place] updated and call back to schedule a follow-up appointment at any time.

## 2015-10-28 ENCOUNTER — Ambulatory Visit: Payer: Commercial Managed Care - HMO

## 2015-10-30 ENCOUNTER — Ambulatory Visit
Admission: RE | Admit: 2015-10-30 | Discharge: 2015-10-30 | Disposition: A | Discharge status: Home / Self Care | Payer: Commercial Managed Care - HMO | Source: Ambulatory Visit | Attending: Internal Medicine | Admitting: Internal Medicine

## 2015-10-30 DIAGNOSIS — K7689 Other specified diseases of liver: Secondary | ICD-10-CM | POA: Diagnosis present

## 2015-10-30 DIAGNOSIS — K5730 Diverticulosis of large intestine without perforation or abscess without bleeding: Secondary | ICD-10-CM | POA: Insufficient documentation

## 2015-10-30 DIAGNOSIS — R188 Other ascites: Secondary | ICD-10-CM | POA: Diagnosis not present

## 2015-10-30 DIAGNOSIS — I700 Atherosclerosis of aorta: Secondary | ICD-10-CM | POA: Diagnosis not present

## 2015-10-30 DIAGNOSIS — K8020 Calculus of gallbladder without cholecystitis without obstruction: Secondary | ICD-10-CM | POA: Insufficient documentation

## 2015-10-30 DIAGNOSIS — J90 Pleural effusion, not elsewhere classified: Secondary | ICD-10-CM | POA: Insufficient documentation

## 2015-10-30 DIAGNOSIS — J8410 Pulmonary fibrosis, unspecified: Secondary | ICD-10-CM | POA: Insufficient documentation

## 2015-10-30 DIAGNOSIS — I517 Cardiomegaly: Secondary | ICD-10-CM | POA: Insufficient documentation

## 2015-10-31 ENCOUNTER — Ambulatory Visit: Admission: RE | Admit: 2015-10-31 | Payer: Commercial Managed Care - HMO | Source: Ambulatory Visit

## 2015-11-11 ENCOUNTER — Telehealth: Payer: Self-pay | Admitting: Family

## 2015-11-11 NOTE — Telephone Encounter (Signed)
Patient called to say that she's recently had an echocardiogram and her PCP told her to avoid foods in a bag, box or processed. She's frustrated because when she looks at food labels, she's not sure what she can eat because "everything has salt in it". Encouraged her to call her PCP to find out how much sodium he wants her to stay under each day. Discussed that we usually have patients try to stay under '2000mg'$  of sodium daily but without knowing the echo results or the details of the conversation they had, I'm not sure if he wants her to have a lower amount of daily sodium. Also advised her that we have a recipe book that she could come by and look at and we could make copies of any recipes that she would like and that a [facility] referral may also be beneficial. Patient was appreciative of the advice and says that she will call her PCP to find out how much sodium per day he wants her to stay under. She is not ready to make a return appointment with [place] at this time.

## 2016-08-29 ENCOUNTER — Encounter: Payer: Self-pay | Admitting: Emergency Medicine

## 2016-08-29 ENCOUNTER — Emergency Department: Payer: Medicare HMO

## 2016-08-29 ENCOUNTER — Emergency Department
Admission: EM | Admit: 2016-08-29 | Discharge: 2016-08-29 | Disposition: A | Discharge status: Home / Self Care | Payer: Medicare HMO | Attending: Emergency Medicine | Admitting: Emergency Medicine

## 2016-08-29 DIAGNOSIS — Z7982 Long term (current) use of aspirin: Secondary | ICD-10-CM | POA: Diagnosis not present

## 2016-08-29 DIAGNOSIS — Z79899 Other long term (current) drug therapy: Secondary | ICD-10-CM | POA: Diagnosis not present

## 2016-08-29 DIAGNOSIS — S80211A Abrasion, right knee, initial encounter: Secondary | ICD-10-CM | POA: Diagnosis not present

## 2016-08-29 DIAGNOSIS — Z85118 Personal history of other malignant neoplasm of bronchus and lung: Secondary | ICD-10-CM | POA: Insufficient documentation

## 2016-08-29 DIAGNOSIS — Y939 Activity, unspecified: Secondary | ICD-10-CM | POA: Insufficient documentation

## 2016-08-29 DIAGNOSIS — Y929 Unspecified place or not applicable: Secondary | ICD-10-CM | POA: Diagnosis not present

## 2016-08-29 DIAGNOSIS — Z87891 Personal history of nicotine dependence: Secondary | ICD-10-CM | POA: Diagnosis not present

## 2016-08-29 DIAGNOSIS — Z8501 Personal history of malignant neoplasm of esophagus: Secondary | ICD-10-CM | POA: Diagnosis not present

## 2016-08-29 DIAGNOSIS — I509 Heart failure, unspecified: Secondary | ICD-10-CM | POA: Insufficient documentation

## 2016-08-29 DIAGNOSIS — S61412A Laceration without foreign body of left hand, initial encounter: Secondary | ICD-10-CM | POA: Diagnosis not present

## 2016-08-29 DIAGNOSIS — Y999 Unspecified external cause status: Secondary | ICD-10-CM | POA: Insufficient documentation

## 2016-08-29 DIAGNOSIS — W010XXA Fall on same level from slipping, tripping and stumbling without subsequent striking against object, initial encounter: Secondary | ICD-10-CM | POA: Insufficient documentation

## 2016-08-29 DIAGNOSIS — S80212A Abrasion, left knee, initial encounter: Secondary | ICD-10-CM | POA: Diagnosis not present

## 2016-08-29 DIAGNOSIS — S6992XA Unspecified injury of left wrist, hand and finger(s), initial encounter: Secondary | ICD-10-CM | POA: Diagnosis present

## 2016-08-29 DIAGNOSIS — I130 Hypertensive heart and chronic kidney disease with heart failure and stage 1 through stage 4 chronic kidney disease, or unspecified chronic kidney disease: Secondary | ICD-10-CM | POA: Diagnosis not present

## 2016-08-29 DIAGNOSIS — S0990XA Unspecified injury of head, initial encounter: Secondary | ICD-10-CM | POA: Insufficient documentation

## 2016-08-29 DIAGNOSIS — N184 Chronic kidney disease, stage 4 (severe): Secondary | ICD-10-CM | POA: Insufficient documentation

## 2016-08-29 DIAGNOSIS — T1490XA Injury, unspecified, initial encounter: Secondary | ICD-10-CM

## 2016-08-29 MED ORDER — LIDOCAINE-EPINEPHRINE (PF) 1 %-1:200000 IJ SOLN
10.0000 mL | Freq: Once | INTRAMUSCULAR | Status: DC
  Filled 2016-08-29: qty 10

## 2016-08-29 MED ORDER — LIDOCAINE HCL (PF) 1 % IJ SOLN
INTRAMUSCULAR | Status: AC
  Filled 2016-08-29: qty 5

## 2016-08-29 MED ORDER — ACETAMINOPHEN 325 MG PO TABS
650.0000 mg | ORAL_TABLET | Freq: Once | ORAL | Status: AC
  Administered 2016-08-29: 650 mg via ORAL
  Filled 2016-08-29: qty 2

## 2016-08-29 MED ORDER — LIDOCAINE-EPINEPHRINE 2 %-1:100000 IJ SOLN
20.0000 mL | Freq: Once | INTRAMUSCULAR | Status: AC
  Administered 2016-08-29: 20 mL
  Filled 2016-08-29: qty 20

## 2016-08-29 NOTE — ED Triage Notes (Signed)
Tripped and fell going into the gym.  Denies LOC

## 2016-08-29 NOTE — Discharge Instructions (Signed)
3 sutures of been established in the left middle finger which require suture removal in 7 days. Clean the wound twice a day with hydrogen peroxide and apply Neosporin and a bandage.  Head laceration I relieved with the current dressing in place for the next 72 hours. This wound requires a continue dressing because of the large left-sided hematoma. Please hold all aspirin therapy over the next 48 hours. Drink plenty of fluids, bed rest, do not Quickly out of the bed.  Please return immediately if condition worsens. Please contact her primary physician or the physician you were given for referral. If you have any specialist physicians involved in her treatment and plan please also contact them. Thank you for using [facility].

## 2016-08-29 NOTE — ED Notes (Signed)
Pt's forehead wound continues to bleed after multiple attempts at steri-strips and surgicell. Dr in with pt.

## 2016-08-29 NOTE — ED Provider Notes (Addendum)
Time Seen: Approximately 1043 I have reviewed the triage notes  Chief Complaint: Fall   History of Present Illness: Gloria Jarvis is a 83 y.o. female who presents today after a non-syncopal fall outside the gym. Patient states she tripped over the curb and landed essentially face first. She has a large hematoma left frontal region. She denies any loss of consciousness prior to or after her injury. She denies any neck, thoracic, or lumbar spine paid ambulate after her injury.she denies any visual disturbances, nausea, vomiting.   Past Medical History:  Diagnosis Date  . Anemia   . Anxiety   . Arthritis   . Depressed   . Esophageal cancer ([place])   . GERD (gastroesophageal reflux disease)   . Hyperlipidemia   . Hypertension   . Lung cancer ([place])   . Renal insufficiency   . Syncope and collapse     Patient Active Problem List   Diagnosis Date Noted  . Hyperkalemia 06/14/2015  . Dysphagia 03/14/2015  . HTN (hypertension) 12/03/2014  . Chronic systolic heart failure ([place]) 10/27/2014  . Chronic kidney disease (CKD), stage IV (severe) ([place]) 10/12/2014  . Decreased body weight 03/05/2014  . Acid reflux 12/05/2013    Past Surgical History:  Procedure Laterality Date  . CARDIAC CATHETERIZATION    . CAROTID ENDARTERECTOMY    . ESOPHAGEAL DILATION    . INSERT / REPLACE / REMOVE PACEMAKER    . PARTIAL HYSTERECTOMY    . WRIST FRACTURE SURGERY      Past Surgical History:  Procedure Laterality Date  . CARDIAC CATHETERIZATION    . CAROTID ENDARTERECTOMY    . ESOPHAGEAL DILATION    . INSERT / REPLACE / REMOVE PACEMAKER    . PARTIAL HYSTERECTOMY    . WRIST FRACTURE SURGERY      Current Outpatient Rx  . Order #: [id number] Class: Historical Med  . Order #: [id number] Class: Historical Med  . Order #: [id number] Class: Normal  . Order #: [id number] Class: Historical Med  . Order #: [id number] Class: Historical Med    Allergies:  Ace inhibitors; Amlodipine; Entresto  [sacubitril-valsartan]; and Furosemide  Family History: Family History  Problem Relation Age of Onset  . Heart disease Mother   . Heart disease Father   . Stroke Father   . COPD Sister     Social History: Social History  Substance Use Topics  . Smoking status: Former Smoker    Packs/day: 1.00    Years: 40.00    Types: Cigarettes    Quit date: 07/28/1987  . Smokeless tobacco: Never Used  . Alcohol use No     Review of Systems:   10 point review of systems was performed and was otherwise negative:  Constitutional: No fever Eyes: No visual disturbances ENT: No sore throat, ear pain Cardiac: No chest pain Respiratory: No shortness of breath, wheezing, or stridor Abdomen: No abdominal pain, no vomiting, No diarrhea Endocrine: No weight loss, No night sweats Extremities: No peripheral edema, cyanosis Skin: No rashes, easy bruising Neurologic: No focal weakness, trouble with speech or swollowing Urologic: No dysuria, Hematuria, or urinary frequency   Physical Exam:  ED Triage Vitals [08/29/16 1112]  Enc Vitals Group     BP (!) 197/69     Pulse Rate 87     Resp 20     Temp 97.9 F (36.6 C)     Temp Source Oral     SpO2 93 %     Weight      Height  Head Circumference      Peak Flow      Pain Score      Pain Loc      Pain Edu?      Excl. in [place]?     General: Awake , Alert , and Oriented times 3; GCS 15 Headlarge left-sided frontal hematoma. Abrasion associated skin tear overlying a small amount of active bleeding Eyes: Pupils equal , round, reactive to light Nose/Throat: No nasal drainage, patent upper airway without erythema or exudate.  Neck: Supple, Full range of motion, No anterior adenopathy or palpable thyroid masses. Good flexion and with some mild discomfort right lateral cervical spine Lungs: Clear to ascultation without wheezes , rhonchi, or rales Heart: Regular rate, regular rhythm without murmurs , gallops , or rubs Abdomen: Soft, non tender  without rebound, guarding , or rigidity; bowel sounds positive and symmetric in all 4 quadrants. No organomegaly .        Extremities: Patient has a 2 cm laceration over the proximal interphalangeal joint of her left middle digit posterior surface. This could strength with flexion and extension. The wound was explored later after anesthesia and does not show any tendon involvement there is other punctate small abrasion wounds on her right hand but none are suturable at this time. She has chronic flexion abnormality in her left index finger.  Both knees have mild abrasions without crepitus or step-off noted. Left leg shows a fairly ecc Neurologic: normal ambulation, Motor symmetric without deficits, sensory intact Skin: warm, dry, no rashes Pacemaker/defibrillator left upper chest wall region    Radiology:   "Ct Head Wo Contrast  Result Date: 08/29/2016 CLINICAL DATA:  Fall today with head injury. Left forehead bruising. History of esophageal and lung cancer. EXAM: CT HEAD WITHOUT CONTRAST CT CERVICAL SPINE WITHOUT CONTRAST TECHNIQUE: Multidetector CT imaging of the head and cervical spine was performed following the standard protocol without intravenous contrast. Multiplanar CT image reconstructions of the cervical spine were also generated. COMPARISON:  CT head 08/20/2014. FINDINGS: CT HEAD FINDINGS Brain: There is no evidence of acute intracranial hemorrhage, mass lesion, brain edema or extra-axial fluid collection. There is diffuse prominence of the ventricles and subarachnoid spaces consistent with mild atrophy for age. Interval development of subcortical encephalomalacia in the right parietal lobe. There is no CT evidence of acute cortical infarction. Vascular: Intracranial vascular calcifications are noted. Skull: Negative for fracture or focal lesion. Sinuses/Orbits: The visualized paranasal sinuses and mastoid air cells are clear. No orbital abnormalities are seen. Other: There is a large left  supraorbital scalp hematoma. CT CERVICAL SPINE FINDINGS Alignment: Normal. Skull base and vertebrae: No evidence of acute cervical spine fracture or traumatic subluxation. Soft tissues and spinal canal: No acute soft tissue findings are demonstrated. There is no evidence of canal hematoma. Extensive carotid atherosclerosis is present bilaterally. Disc levels: Minimal spondylosis for age with preserved disc spaces. There is mild facet hypertrophy, greatest on the right at C3-4. Upper chest: No significant findings. Other: TMJ degenerative changes are noted bilaterally. IMPRESSION: 1. Large left supraorbital hematoma. No evidence of orbital injury or fracture. 2. No acute intracranial findings. 3. Generalized atrophy with interval development of right parietal subcortical encephalomalacia. 4. No evidence of acute cervical spine fracture, traumatic subluxation or static signs of instability. Electronically Signed   By: Richardean Sale M.D.   On: 08/29/2016 11:49   Ct Cervical Spine Wo Contrast  Result Date: 08/29/2016 CLINICAL DATA:  Fall today with head injury. Left forehead bruising. History of  esophageal and lung cancer. EXAM: CT HEAD WITHOUT CONTRAST CT CERVICAL SPINE WITHOUT CONTRAST TECHNIQUE: Multidetector CT imaging of the head and cervical spine was performed following the standard protocol without intravenous contrast. Multiplanar CT image reconstructions of the cervical spine were also generated. COMPARISON:  CT head 08/20/2014. FINDINGS: CT HEAD FINDINGS Brain: There is no evidence of acute intracranial hemorrhage, mass lesion, brain edema or extra-axial fluid collection. There is diffuse prominence of the ventricles and subarachnoid spaces consistent with mild atrophy for age. Interval development of subcortical encephalomalacia in the right parietal lobe. There is no CT evidence of acute cortical infarction. Vascular: Intracranial vascular calcifications are noted. Skull: Negative for fracture or focal  lesion. Sinuses/Orbits: The visualized paranasal sinuses and mastoid air cells are clear. No orbital abnormalities are seen. Other: There is a large left supraorbital scalp hematoma. CT CERVICAL SPINE FINDINGS Alignment: Normal. Skull base and vertebrae: No evidence of acute cervical spine fracture or traumatic subluxation. Soft tissues and spinal canal: No acute soft tissue findings are demonstrated. There is no evidence of canal hematoma. Extensive carotid atherosclerosis is present bilaterally. Disc levels: Minimal spondylosis for age with preserved disc spaces. There is mild facet hypertrophy, greatest on the right at C3-4. Upper chest: No significant findings. Other: TMJ degenerative changes are noted bilaterally. IMPRESSION: 1. Large left supraorbital hematoma. No evidence of orbital injury or fracture. 2. No acute intracranial findings. 3. Generalized atrophy with interval development of right parietal subcortical encephalomalacia. 4. No evidence of acute cervical spine fracture, traumatic subluxation or static signs of instability. Electronically Signed   By: Richardean Sale M.D.   On: 08/29/2016 11:49   Dg Hand 2 View Left  Result Date: 08/29/2016 CLINICAL DATA:  Pain after fall. EXAM: LEFT HAND - 2 VIEW COMPARISON:  Wrist x-ray May 06, 2014 FINDINGS: Evaluation is limited due to patient positioning. No acute fractures are seen. Degenerative changes, most marked at the second DIP with apparent subluxation in this region. Repair of distal radius fracture. No other acute abnormalities. IMPRESSION: 1. Subluxation at the second DIP may be due to the severe degenerative changes in this location. Recommend clinical correlation. No other acute abnormalities. Electronically Signed   By: Dorise Bullion III M.D   On: 08/29/2016 13:24   Dg Knee 3 View Left  Result Date: 08/29/2016 CLINICAL DATA:  Pain after fall. EXAM: LEFT KNEE - 3 VIEW COMPARISON:  None. FINDINGS: No evidence of fracture, dislocation, or  joint effusion. No evidence of arthropathy or other focal bone abnormality. Soft tissues are unremarkable. IMPRESSION: Negative. Electronically Signed   By: Dorise Bullion III M.D   On: 08/29/2016 13:25  "   I personally reviewed the radiologic studies   Procedures: Left finger laceration repair 2 cm non-complicated   Patient's finger was prepped and draped in sterile fashion anesthetized locally with 1% lidocaine without epinephrine. Wound was copiously irrigated and explored and does not involve the joint capsule. The extensor tendon. Repair with 3 interrupted 4. 0 Prolene sutures. Wound was cleaned and dressed and the patient tolerated the procedure well   ED Course:  The patient has a large left frontal hematoma that continues to ooze blood through an area of small skin tears at the surface. We initially tried Steri-Strip placement which did not stop the bleeding patient currently has Gelfoam established and seems to be slowing down the bleeding and she'll likely be discharged with a pressure dressing. The patient has some other contusions and abrasions but does not appear to  have suffered any new fractures and no significant intracranial injury from her non-syncopal fall.   We had difficulty getting the bleeding stopped from her frontal wound. Patient had some steady flow and appears to have a small skin artery is difficult to access due to the overlying hematoma. Patient had injection of lidocaine with epinephrine and had died a single 6-0 Vicryl stitch which seemed to slow the bleeding and then reapplied Surgicel on top of the wound. Patient also received some Dermabond. Patient will receive a pressure dressing.  Assessment:  Status post fall with acute closed head injury Large left-sided supraorbital hematoma Suture repair simple single layer closure left middle finger   Plan: * Outpatient Patient was advised to return immediately if condition worsens. Patient was advised to  follow up with their primary care physician or other specialized physicians involved in their outpatient care. The patient and/or family member/power of attorney had laboratory results reviewed at the bedside. All questions and concerns were addressed and appropriate discharge instructions were distributed by the nursing staff.             Daymon Larsen, MD 08/29/16 Elbert Quigley, MD 08/29/16 [phone]

## 2016-08-29 NOTE — ED Notes (Signed)
Awaiting for son to come pick up pt.

## 2016-08-30 ENCOUNTER — Other Ambulatory Visit: Payer: Self-pay | Admitting: Family

## 2016-09-11 ENCOUNTER — Inpatient Hospital Stay
Admission: EM | Admit: 2016-09-11 | Discharge: 2016-09-14 | DRG: 193 | Disposition: A | Discharge status: Home / Self Care | Payer: Medicare HMO | Attending: Internal Medicine | Admitting: Internal Medicine

## 2016-09-11 ENCOUNTER — Encounter: Payer: Self-pay | Admitting: Emergency Medicine

## 2016-09-11 ENCOUNTER — Emergency Department: Payer: Medicare HMO

## 2016-09-11 ENCOUNTER — Inpatient Hospital Stay: Payer: Medicare HMO

## 2016-09-11 DIAGNOSIS — J9601 Acute respiratory failure with hypoxia: Secondary | ICD-10-CM | POA: Diagnosis present

## 2016-09-11 DIAGNOSIS — J180 Bronchopneumonia, unspecified organism: Secondary | ICD-10-CM

## 2016-09-11 DIAGNOSIS — E440 Moderate protein-calorie malnutrition: Secondary | ICD-10-CM | POA: Diagnosis present

## 2016-09-11 DIAGNOSIS — I2729 Other secondary pulmonary hypertension: Secondary | ICD-10-CM | POA: Diagnosis present

## 2016-09-11 DIAGNOSIS — I130 Hypertensive heart and chronic kidney disease with heart failure and stage 1 through stage 4 chronic kidney disease, or unspecified chronic kidney disease: Secondary | ICD-10-CM | POA: Diagnosis present

## 2016-09-11 DIAGNOSIS — Z87891 Personal history of nicotine dependence: Secondary | ICD-10-CM | POA: Diagnosis not present

## 2016-09-11 DIAGNOSIS — Z950 Presence of cardiac pacemaker: Secondary | ICD-10-CM

## 2016-09-11 DIAGNOSIS — Z8249 Family history of ischemic heart disease and other diseases of the circulatory system: Secondary | ICD-10-CM | POA: Diagnosis not present

## 2016-09-11 DIAGNOSIS — Z8501 Personal history of malignant neoplasm of esophagus: Secondary | ICD-10-CM | POA: Diagnosis not present

## 2016-09-11 DIAGNOSIS — Z888 Allergy status to other drugs, medicaments and biological substances status: Secondary | ICD-10-CM | POA: Diagnosis not present

## 2016-09-11 DIAGNOSIS — N179 Acute kidney failure, unspecified: Secondary | ICD-10-CM | POA: Diagnosis present

## 2016-09-11 DIAGNOSIS — N170 Acute kidney failure with tubular necrosis: Secondary | ICD-10-CM | POA: Diagnosis present

## 2016-09-11 DIAGNOSIS — I313 Pericardial effusion (noninflammatory): Secondary | ICD-10-CM | POA: Diagnosis present

## 2016-09-11 DIAGNOSIS — Y92838 Other recreation area as the place of occurrence of the external cause: Secondary | ICD-10-CM

## 2016-09-11 DIAGNOSIS — W109XXA Fall (on) (from) unspecified stairs and steps, initial encounter: Secondary | ICD-10-CM | POA: Diagnosis present

## 2016-09-11 DIAGNOSIS — J1008 Influenza due to other identified influenza virus with other specified pneumonia: Principal | ICD-10-CM | POA: Diagnosis present

## 2016-09-11 DIAGNOSIS — Z825 Family history of asthma and other chronic lower respiratory diseases: Secondary | ICD-10-CM

## 2016-09-11 DIAGNOSIS — M1990 Unspecified osteoarthritis, unspecified site: Secondary | ICD-10-CM | POA: Diagnosis present

## 2016-09-11 DIAGNOSIS — J439 Emphysema, unspecified: Secondary | ICD-10-CM | POA: Diagnosis present

## 2016-09-11 DIAGNOSIS — Z823 Family history of stroke: Secondary | ICD-10-CM | POA: Diagnosis not present

## 2016-09-11 DIAGNOSIS — I5022 Chronic systolic (congestive) heart failure: Secondary | ICD-10-CM

## 2016-09-11 DIAGNOSIS — R079 Chest pain, unspecified: Secondary | ICD-10-CM

## 2016-09-11 DIAGNOSIS — E875 Hyperkalemia: Secondary | ICD-10-CM | POA: Diagnosis not present

## 2016-09-11 DIAGNOSIS — F419 Anxiety disorder, unspecified: Secondary | ICD-10-CM | POA: Diagnosis present

## 2016-09-11 DIAGNOSIS — I350 Nonrheumatic aortic (valve) stenosis: Secondary | ICD-10-CM | POA: Diagnosis present

## 2016-09-11 DIAGNOSIS — D631 Anemia in chronic kidney disease: Secondary | ICD-10-CM | POA: Diagnosis present

## 2016-09-11 DIAGNOSIS — N183 Chronic kidney disease, stage 3 (moderate): Secondary | ICD-10-CM | POA: Diagnosis present

## 2016-09-11 DIAGNOSIS — Z85118 Personal history of other malignant neoplasm of bronchus and lung: Secondary | ICD-10-CM | POA: Diagnosis not present

## 2016-09-11 DIAGNOSIS — I5042 Chronic combined systolic (congestive) and diastolic (congestive) heart failure: Secondary | ICD-10-CM | POA: Diagnosis present

## 2016-09-11 DIAGNOSIS — I482 Chronic atrial fibrillation: Secondary | ICD-10-CM | POA: Diagnosis present

## 2016-09-11 DIAGNOSIS — Z79899 Other long term (current) drug therapy: Secondary | ICD-10-CM

## 2016-09-11 DIAGNOSIS — S0083XA Contusion of other part of head, initial encounter: Secondary | ICD-10-CM | POA: Diagnosis present

## 2016-09-11 DIAGNOSIS — E785 Hyperlipidemia, unspecified: Secondary | ICD-10-CM | POA: Diagnosis present

## 2016-09-11 DIAGNOSIS — K219 Gastro-esophageal reflux disease without esophagitis: Secondary | ICD-10-CM | POA: Diagnosis present

## 2016-09-11 DIAGNOSIS — Z681 Body mass index (BMI) 19 or less, adult: Secondary | ICD-10-CM | POA: Diagnosis not present

## 2016-09-11 DIAGNOSIS — I700 Atherosclerosis of aorta: Secondary | ICD-10-CM | POA: Diagnosis present

## 2016-09-11 DIAGNOSIS — Z902 Acquired absence of lung [part of]: Secondary | ICD-10-CM

## 2016-09-11 DIAGNOSIS — I5082 Biventricular heart failure: Secondary | ICD-10-CM | POA: Diagnosis present

## 2016-09-11 DIAGNOSIS — E860 Dehydration: Secondary | ICD-10-CM | POA: Diagnosis present

## 2016-09-11 DIAGNOSIS — R0602 Shortness of breath: Secondary | ICD-10-CM

## 2016-09-11 DIAGNOSIS — J101 Influenza due to other identified influenza virus with other respiratory manifestations: Secondary | ICD-10-CM | POA: Diagnosis present

## 2016-09-11 HISTORY — DX: Heart failure, unspecified: I50.9

## 2016-09-11 LAB — CBC WITH DIFFERENTIAL/PLATELET
BASOS PCT: 0 %
Band Neutrophils: 1 %
Basophils Absolute: 0 10*3/uL (ref 0–0.1)
Blasts: 0 %
EOS ABS: 0 10*3/uL (ref 0–0.7)
EOS PCT: 0 %
HCT: 35.5 % (ref 35.0–47.0)
Hemoglobin: 12 g/dL (ref 12.0–16.0)
LYMPHS ABS: 0.9 10*3/uL — AB (ref 1.0–3.6)
LYMPHS PCT: 22 %
MCH: 34.1 pg — AB (ref 26.0–34.0)
MCHC: 33.7 g/dL (ref 32.0–36.0)
MCV: 101.2 fL — ABNORMAL HIGH (ref 80.0–100.0)
MONO ABS: 0.4 10*3/uL (ref 0.2–0.9)
Metamyelocytes Relative: 0 %
Monocytes Relative: 10 %
Myelocytes: 1 %
NRBC: 0 /100{WBCs}
Neutro Abs: 2.9 10*3/uL (ref 1.4–6.5)
Neutrophils Relative %: 66 %
OTHER: 0 %
PROMYELOCYTES ABS: 0 %
Platelets: 99 10*3/uL — ABNORMAL LOW (ref 150–440)
RBC: 3.51 MIL/uL — ABNORMAL LOW (ref 3.80–5.20)
RDW: 16.1 % — AB (ref 11.5–14.5)
WBC: 4.2 10*3/uL (ref 3.6–11.0)

## 2016-09-11 LAB — COMPREHENSIVE METABOLIC PANEL
ALBUMIN: 3.4 g/dL — AB (ref 3.5–5.0)
ALT: 8 U/L — ABNORMAL LOW (ref 14–54)
ANION GAP: 9 (ref 5–15)
AST: 25 U/L (ref 15–41)
Alkaline Phosphatase: 78 U/L (ref 38–126)
BUN: 77 mg/dL — ABNORMAL HIGH (ref 6–20)
CALCIUM: 8.8 mg/dL — AB (ref 8.9–10.3)
CO2: 31 mmol/L (ref 22–32)
Chloride: 100 mmol/L — ABNORMAL LOW (ref 101–111)
Creatinine, Ser: 3.26 mg/dL — ABNORMAL HIGH (ref 0.44–1.00)
GFR calc non Af Amer: 12 mL/min — ABNORMAL LOW (ref >60)
GFR, EST AFRICAN AMERICAN: 14 mL/min — AB (ref >60)
GLUCOSE: 106 mg/dL — AB (ref 65–99)
POTASSIUM: 4.5 mmol/L (ref 3.5–5.1)
SODIUM: 140 mmol/L (ref 135–145)
Total Bilirubin: 1.1 mg/dL (ref 0.3–1.2)
Total Protein: 6.9 g/dL (ref 6.5–8.1)

## 2016-09-11 LAB — BRAIN NATRIURETIC PEPTIDE: B NATRIURETIC PEPTIDE 5: 2764 pg/mL — AB (ref 0.0–100.0)

## 2016-09-11 LAB — INFLUENZA PANEL BY PCR (TYPE A & B)
INFLAPCR: POSITIVE — AB
INFLBPCR: NEGATIVE

## 2016-09-11 LAB — BLOOD GAS, VENOUS
Acid-Base Excess: 6.8 mmol/L — ABNORMAL HIGH (ref 0.0–2.0)
Bicarbonate: 33.3 mmol/L — ABNORMAL HIGH (ref 20.0–28.0)
Patient temperature: 37
pCO2, Ven: 55 mmHg (ref 44.0–60.0)
pH, Ven: 7.39 (ref 7.250–7.430)

## 2016-09-11 LAB — TROPONIN I: Troponin I: 0.07 ng/mL (ref <0.03)

## 2016-09-11 MED ORDER — PRAVASTATIN SODIUM 20 MG PO TABS
20.0000 mg | ORAL_TABLET | Freq: Every day | ORAL | Status: DC
  Administered 2016-09-12 – 2016-09-14 (×3): 20 mg via ORAL
  Filled 2016-09-11 (×3): qty 1

## 2016-09-11 MED ORDER — PANTOPRAZOLE SODIUM 40 MG PO TBEC
40.0000 mg | DELAYED_RELEASE_TABLET | Freq: Two times a day (BID) | ORAL | Status: DC
  Administered 2016-09-12 – 2016-09-14 (×6): 40 mg via ORAL
  Filled 2016-09-11 (×6): qty 1

## 2016-09-11 MED ORDER — SODIUM CHLORIDE 0.9 % IV BOLUS (SEPSIS)
500.0000 mL | Freq: Once | INTRAVENOUS | Status: AC
  Administered 2016-09-11: 500 mL via INTRAVENOUS

## 2016-09-11 MED ORDER — IPRATROPIUM-ALBUTEROL 0.5-2.5 (3) MG/3ML IN SOLN
3.0000 mL | Freq: Once | RESPIRATORY_TRACT | Status: AC
  Administered 2016-09-11: 3 mL via RESPIRATORY_TRACT
  Filled 2016-09-11: qty 3

## 2016-09-11 MED ORDER — HYDRALAZINE HCL 20 MG/ML IJ SOLN: 10.0000 mg | Freq: Four times a day (QID) | INTRAMUSCULAR | Status: DC | PRN

## 2016-09-11 MED ORDER — ACETAMINOPHEN 325 MG PO TABS
650.0000 mg | ORAL_TABLET | Freq: Four times a day (QID) | ORAL | Status: DC | PRN
  Administered 2016-09-13: 650 mg via ORAL
  Filled 2016-09-11: qty 2

## 2016-09-11 MED ORDER — LEVOFLOXACIN IN D5W 750 MG/150ML IV SOLN
750.0000 mg | Freq: Once | INTRAVENOUS | Status: AC
  Administered 2016-09-11: 750 mg via INTRAVENOUS
  Filled 2016-09-11: qty 150

## 2016-09-11 MED ORDER — SODIUM CHLORIDE 0.9% FLUSH
3.0000 mL | Freq: Two times a day (BID) | INTRAVENOUS | Status: DC
  Administered 2016-09-11 – 2016-09-14 (×6): 3 mL via INTRAVENOUS

## 2016-09-11 MED ORDER — SODIUM CHLORIDE 0.9 % IV SOLN
INTRAVENOUS | Status: DC
  Administered 2016-09-11 – 2016-09-12 (×2): via INTRAVENOUS

## 2016-09-11 MED ORDER — MAGNESIUM OXIDE 400 (241.3 MG) MG PO TABS
400.0000 mg | ORAL_TABLET | Freq: Every day | ORAL | Status: DC
  Administered 2016-09-12 – 2016-09-14 (×3): 400 mg via ORAL
  Filled 2016-09-11 (×3): qty 1

## 2016-09-11 MED ORDER — ONDANSETRON HCL 4 MG/2ML IJ SOLN: 4.0000 mg | Freq: Four times a day (QID) | INTRAMUSCULAR | Status: DC | PRN

## 2016-09-11 MED ORDER — CARVEDILOL 12.5 MG PO TABS
12.5000 mg | ORAL_TABLET | Freq: Two times a day (BID) | ORAL | Status: DC
  Administered 2016-09-12 – 2016-09-14 (×6): 12.5 mg via ORAL
  Filled 2016-09-11 (×6): qty 1

## 2016-09-11 MED ORDER — ONDANSETRON HCL 4 MG PO TABS
4.0000 mg | ORAL_TABLET | Freq: Four times a day (QID) | ORAL | Status: DC | PRN
Start: 2016-09-11 — End: 2016-09-15
  Administered 2016-09-13: 4 mg via ORAL
  Filled 2016-09-11: qty 1

## 2016-09-11 MED ORDER — ACETAMINOPHEN 650 MG RE SUPP: 650.0000 mg | Freq: Four times a day (QID) | RECTAL | Status: DC | PRN

## 2016-09-11 MED ORDER — HEPARIN SODIUM (PORCINE) 5000 UNIT/ML IJ SOLN
5000.0000 [IU] | Freq: Three times a day (TID) | INTRAMUSCULAR | Status: DC
  Administered 2016-09-11 – 2016-09-14 (×8): 5000 [IU] via SUBCUTANEOUS
  Filled 2016-09-11 (×8): qty 1

## 2016-09-11 MED ORDER — LORAZEPAM 1 MG PO TABS
1.0000 mg | ORAL_TABLET | Freq: Once | ORAL | Status: AC
  Administered 2016-09-11: 1 mg via ORAL
  Filled 2016-09-11: qty 1

## 2016-09-11 NOTE — H&P (Signed)
[facility] [place] NAME: Gloria Jarvis    MR#:  [id number]  DATE OF BIRTH:  06/05/1929  DATE OF ADMISSION:  09/11/2016  PRIMARY CARE PHYSICIAN: Rusty Aus, MD   REQUESTING/REFERRING PHYSICIAN: Dr. Merlyn Lot  CHIEF COMPLAINT:   Chief Complaint  Patient presents with  . Shortness of Breath  . Chest Pain    HISTORY OF PRESENT ILLNESS:  Gloria Jarvis  is a 83 y.o. female with a known history of Atrial fibrillation not on any anticoagulation, GERD, hypertension, congestive heart failure and malnutrition presents to hospital secondary to chest pain, difficulty breathing and weakness. Patient is a poor historian. Most of the history is obtained from patient's son at bedside and also the ER physician.  2 weeks ago patient had a fall against concrete hitting the left side of her face resulting in a large hematoma on the left forehead did also ecchymosis of the left side of the face. She was on Lasix and metolazone at home. Patient states her intake has been decreased over several months and she has lost almost 10 pounds in the last few weeks.  He does not wear any home oxygen. She started having chest pain today associated with shortness of breath. Her troponin is slightly elevated in the emergency room. CT of the chest showing bronchopneumonia. Also has significant cardiomegaly and enlarged pulmonary vessels indicated to of pulmonary hypertension. Also lab work indicated worsening renal function.                                                                            PAST MEDICAL HISTORY:   Past Medical History:  Diagnosis Date  . Anemia   . Anxiety   . Arthritis   . Congestive heart failure (CHF) ([place])   . Depressed   . Esophageal cancer ([place])   . GERD (gastroesophageal reflux disease)   . Hyperlipidemia   . Hypertension   . Lung cancer ([place])   . Renal insufficiency   . Syncope and collapse     PAST SURGICAL HISTORY:   Past  Surgical History:  Procedure Laterality Date  . CARDIAC CATHETERIZATION    . CAROTID ENDARTERECTOMY    . ESOPHAGEAL DILATION    . INSERT / REPLACE / REMOVE PACEMAKER    . PARTIAL HYSTERECTOMY    . WRIST FRACTURE SURGERY      SOCIAL HISTORY:   Social History  Substance Use Topics  . Smoking status: Former Smoker    Packs/day: 1.00    Years: 40.00    Types: Cigarettes    Quit date: 07/28/1987  . Smokeless tobacco: Never Used  . Alcohol use No    FAMILY HISTORY:   Family History  Problem Relation Age of Onset  . Heart disease Mother   . Heart disease Father   . Stroke Father   . COPD Sister     DRUG ALLERGIES:   Allergies  Allergen Reactions  . Ace Inhibitors Cough  . Amlodipine Diarrhea and Nausea And Vomiting  . Entresto [Sacubitril-Valsartan] Other (See Comments)    Back pain  . Furosemide Other (See Comments)    Blood sugar dropped    REVIEW OF  SYSTEMS:   Review of Systems  Constitutional: Positive for malaise/fatigue. Negative for chills, fever and weight loss.  HENT: Negative for ear discharge, hearing loss and nosebleeds.   Eyes: Negative for blurred vision and double vision.  Respiratory: Positive for shortness of breath. Negative for cough, hemoptysis and wheezing.   Cardiovascular: Positive for chest pain. Negative for palpitations, orthopnea and leg swelling.  Gastrointestinal: Negative for abdominal pain, constipation, diarrhea, melena, nausea and vomiting.  Genitourinary: Positive for dysuria.  Musculoskeletal: Positive for falls and myalgias. Negative for back pain and neck pain.  Skin: Negative for rash.  Neurological: Negative for dizziness, tremors, sensory change, speech change, focal weakness and headaches.  Endo/Heme/Allergies: Does not bruise/bleed easily.  Psychiatric/Behavioral: Negative for depression.    MEDICATIONS AT HOME:   Prior to Admission medications   Medication Sig Start Date End Date Taking? Authorizing Provider    acetaminophen (TYLENOL) 500 MG tablet Take 500 mg by mouth every 6 (six) hours as needed for headache.   Yes Historical Provider, MD  carvedilol (COREG) 12.5 MG tablet take 1 tablet by mouth twice a day 07/08/15  Yes Alisa Graff, FNP  magnesium oxide (MAG-OX) 400 (241.3 Mg) MG tablet Take 400 mg by mouth daily. 08/31/16  Yes Historical Provider, MD  omeprazole (PRILOSEC) 20 MG capsule Take 20 mg by mouth 2 (two) times daily before a meal.   Yes Historical Provider, MD  pravastatin (PRAVACHOL) 20 MG tablet Take 20 mg by mouth daily.   Yes Historical Provider, MD  triamterene-hydrochlorothiazide (MAXZIDE-25) 37.5-25 MG tablet Take 1 tablet by mouth daily. 09/01/16  Yes Historical Provider, MD      VITAL SIGNS:  Blood pressure (!) 155/64, pulse 85, temperature 98.2 F (36.8 C), temperature source Oral, resp. rate (!) 24, height '5\' 2"'$  (1.575 m), weight 38.6 kg (85 lb), SpO2 100 %.  PHYSICAL EXAMINATION:   Physical Exam  GENERAL:  83 y.o.-year-old elderly and very thin built patient lying in the bed with no acute distress.  EYES: Pupils equal, round, reactive to light and accommodation. No scleral icterus. Extraocular muscles intact.  HEENT: Head normocephalic. Bruise on the left side of her face due to recent trauma and especially hematoma on left fore head. Oropharynx and nasopharynx clear.  NECK:  Supple, no jugular venous distention. No thyroid enlargement, no tenderness.  LUNGS: coarse breath sounds bilaterally, no wheezing, rales or crepitation. No use of accessory muscles of respiration.  CARDIOVASCULAR: S1, S2 normal. No rubs, or gallops. 2/6 systolic murmur present. ABDOMEN: Soft, nontender, nondistended. Bowel sounds present. No organomegaly or mass.  EXTREMITIES: No pedal edema, cyanosis, or clubbing.  NEUROLOGIC: Cranial nerves II through XII are intact. Muscle strength 5/5 in all extremities. Sensation intact. Gait not checked. Global weakness noted. PSYCHIATRIC: The patient is  alert and oriented x 3.  SKIN: No obvious rash, lesion, or ulcer.   LABORATORY PANEL:   CBC  Recent Labs Lab 09/11/16 1650  WBC 4.2  HGB 12.0  HCT 35.5  PLT 99*   ------------------------------------------------------------------------------------------------------------------  Chemistries   Recent Labs Lab 09/11/16 1650  NA 140  K 4.5  CL 100*  CO2 31  GLUCOSE 106*  BUN 77*  CREATININE 3.26*  CALCIUM 8.8*  AST 25  ALT 8*  ALKPHOS 78  BILITOT 1.1   ------------------------------------------------------------------------------------------------------------------  Cardiac Enzymes  Recent Labs Lab 09/11/16 1650  TROPONINI 0.07*   ------------------------------------------------------------------------------------------------------------------  RADIOLOGY:  Dg Chest 2 View  Result Date: 09/11/2016 CLINICAL DATA:  83 year old female with a history  of shortness of breath. Prior esophageal carcinoma EXAM: CHEST  2 VIEW COMPARISON:  01/22/2014, 12/25/2013. Most recent abdominal CT 10/30/2015 FINDINGS: Cardiomediastinal silhouette unchanged with cardiomegaly. Calcifications of the aortic arch and descending thoracic aorta. Surgical changes projecting over the lower mediastinum again noted. Unchanged cardiac pacing device on left chest wall with 2 leads in place. Pleural effusions at the bilateral lung bases obscuring the hemidiaphragm, blunting the costophrenic sulci. These appear unchanged dating to the comparison chest x-ray of 12/25/2013 Small nodules of the upper right lung, not appreciated on the comparison study. Persisting granuloma at the left lung base. Questionable interlobular septal thickening. No pneumothorax. Osteopenia. No displaced fracture. Multilevel degenerative changes of the spine. IMPRESSION: Small bilateral pleural effusions and associated atelectasis/ consolidation. Interlobular septal thickening and right upper lobe nodularity most likely relates to  edema given the presence of the effusions, however, given the patient's known history of prior malignancy, follow-up chest x-ray may be considered in 3-4 weeks to assure resolution. Aortic atherosclerosis and cardiomegaly. Unchanged cardiac pacing device on the left chest wall. Surgical changes of the lower mediastinum. Signed, Dulcy Fanny. Earleen Newport, DO [facility] Radiology Electronically Signed   By: Corrie Mckusick D.O.   On: 09/11/2016 16:36   Ct Chest Wo Contrast  Result Date: 09/11/2016 CLINICAL DATA:  Chest pain and shortness of breath. History of lung cancer status post right lower lobectomy. EXAM: CT CHEST WITHOUT CONTRAST TECHNIQUE: Multidetector CT imaging of the chest was performed following the standard protocol without IV contrast. COMPARISON:  10/08/2014 PET-CT. 06/01/2014 CT of chest, abdomen, and pelvis. FINDINGS: Cardiovascular: Severe cardiomegaly. Moderate pericardial effusion. Aortic valvular calcification. Severe coronary artery calcification. Enlarged main pulmonary artery measuring 3.9 cm. Ascending aorta measures 3.8 cm. Aortic atherosclerosis with extensive calcification. Mediastinum/Nodes: 9 mm nodule within the left lobe of the thyroid. Patulous esophagus containing fluid and debris. No lymphadenopathy. Lungs/Pleura: Clustered nodules and ground-glass opacities in a bronchovascular distribution with associated bronchial wall thickening and mucous plugging likely represents bronchopneumonia. Smooth interlobular septal thickening and small bilateral pleural effusions is compatible with interstitial pulmonary edema. Right lower lobectomy. Stable left lower lobe calcified granuloma. Upper Abdomen: Ascites in the perihepatic and perisplenic regions. Indeterminate 12 mm left adrenal nodule stable from 2015, probably an adenoma. Musculoskeletal: No chest wall mass or suspicious bone lesions identified. IMPRESSION: 1. Clustered nodules on glass  opacities and bronchovascular distribution with bronchial thickening and mucous plugging probably representing which bronchopneumonia. Follow-up to resolution is recommended given history of pulmonary malignancy. 2. Interstitial pulmonary edema. 3. Small bilateral pleural effusions greater on the left. 4. Severe cardiomegaly. 5. Moderate pericardial effusion. 6. Aortic valvular calcification associated with aortic stenosis. 7. Severe coronary artery calcification. 8. Enlarged main pulmonary artery indicates underlying pulmonary artery hypertension. 9. Moderate emphysema. 10. Patulous and fluid-filled esophagus. 11. Perisplenic and perihepatic ascites. 12. 12 mm left adrenal nodule stable from 2015, probably adenoma. Electronically Signed   By: Kristine Garbe M.D.   On: 09/11/2016 18:28    EKG:   Orders placed or performed during the hospital encounter of 09/11/16  . ED EKG  . ED EKG  . EKG 12-Lead  . EKG 12-Lead    IMPRESSION AND PLAN:   Gloria Jarvis  is a 83 y.o. female with a known history of Atrial fibrillation not on any anticoagulation, GERD, hypertension, congestive heart failure and malnutrition presents to hospital secondary to chest pain, difficulty breathing and weakness.  #1 Bronchopneumonia-oxygen support as needed. -Blood cultures ordered. Started on  Levaquin.  #2 ARF-could be prerenal versus cardiorenal syndrome. -Sense clinically appears dry, will give IV fluids for 12 hours gently. -Hold Diuretics.  Renal ultrasound ordered. -Nephrology consult. Follow-up repeat BNP in the morning.  -Baseline creatinine is around 2.5 to 2.8  #3 CHF- chronic systolic CHF, last echo from last year with EF of 25% and severely dilated left ventricle. -Also has significant pulmonary hypertension and right heart failure. BNP is elevated. However clinically appears dehydrated. -We'll gently give IV fluids for only 12 hours and reassess. Hold her diuretics for now -Follow up  echocardiogram again. CT revealing moderate pericardial effusion.  #4 hypertension-continue Coreg. But hold triamterene, hydrochlorothiazide, Lasix and metolazone  #5 hyperlipidemia-continue statin  #6 DVT prophylaxis-on subcutaneous heparin   Physical therapy consulted    All the records are reviewed and case discussed with ED provider. Management plans discussed with the patient, family and they are in agreement.  CODE STATUS: Full Code  TOTAL TIME TAKING CARE OF THIS PATIENT: 50 minutes.    Gladstone Lighter M.D on 09/11/2016 at 8:56 PM  Between 7am to 6pm - Pager - [phone]  After 6pm go to [email or web address] - password [email or web address] [place]  Office  [phone]  CC: Primary care physician; Rusty Aus, MD

## 2016-09-11 NOTE — ED Notes (Signed)
Attempted to call report,

## 2016-09-11 NOTE — ED Triage Notes (Signed)
Pt to ED via EMS from home c/o increased SOB and chest pain.  Hx of lung cancer, right lower lobe removed.  Patient has pacemaker.  Patient takes blood thinner.  EMS vitals 160/67 BP, 100 HR, 97%

## 2016-09-11 NOTE — ED Notes (Signed)
Patient transported to CT 

## 2016-09-11 NOTE — ED Provider Notes (Signed)
[facility] Emergency Department Provider Note    First MD Initiated Contact with Patient 09/11/16 1537     (approximate)  I have reviewed the triage vital signs and the nursing notes.   HISTORY  Chief Complaint Shortness of Breath and Chest Pain    HPI JOHNANNA Jarvis is a 83 y.o. female with a history of anemia and anxiety as well as esophageal cancer, lung cancer and congestive heart failure presents with worsening shortness of breath and chest pain started this morning. States that she is on blood thinner but cannot recall the type. States that she does not wear home oxygen. She had a recent fall and was at her primary care physician's office today and had sutures removed. She then went home   Past Medical History:  Diagnosis Date  . Anemia   . Anxiety   . Arthritis   . Depressed   . Esophageal cancer ([place])   . GERD (gastroesophageal reflux disease)   . Hyperlipidemia   . Hypertension   . Lung cancer ([name])   . Renal insufficiency   . Syncope and collapse    Family History  Problem Relation Age of Onset  . Heart disease Mother   . Heart disease Father   . Stroke Father   . COPD Sister    Past Surgical History:  Procedure Laterality Date  . CARDIAC CATHETERIZATION    . CAROTID ENDARTERECTOMY    . ESOPHAGEAL DILATION    . INSERT / REPLACE / REMOVE PACEMAKER    . PARTIAL HYSTERECTOMY    . WRIST FRACTURE SURGERY     Patient Active Problem List   Diagnosis Date Noted  . Hyperkalemia 06/14/2015  . Dysphagia 03/14/2015  . HTN (hypertension) 12/03/2014  . Chronic systolic heart failure ([name]) 10/27/2014  . Chronic kidney disease (CKD), stage IV (severe) ([name]) 10/12/2014  . Decreased body weight 03/05/2014  . Acid reflux 12/05/2013      Prior to Admission medications   Medication Sig Start Date End Date Taking? Authorizing Provider  acetaminophen (TYLENOL) 500 MG tablet Take 500 mg by mouth every 6 (six) hours as needed for headache.    Yes Historical Provider, MD  carvedilol (COREG) 12.5 MG tablet take 1 tablet by mouth twice a day 07/08/15  Yes Alisa Graff, FNP  magnesium oxide (MAG-OX) 400 (241.3 Mg) MG tablet Take 400 mg by mouth daily. 08/31/16  Yes Historical Provider, MD  omeprazole (PRILOSEC) 20 MG capsule Take 20 mg by mouth 2 (two) times daily before a meal.   Yes Historical Provider, MD  pravastatin (PRAVACHOL) 20 MG tablet Take 20 mg by mouth daily.   Yes Historical Provider, MD  triamterene-hydrochlorothiazide (MAXZIDE-25) 37.5-25 MG tablet Take 1 tablet by mouth daily. 09/01/16  Yes Historical Provider, MD    Allergies Ace inhibitors; Amlodipine; Entresto [sacubitril-valsartan]; and Furosemide    Social History Social History  Substance Use Topics  . Smoking status: Former Smoker    Packs/day: 1.00    Years: 40.00    Types: Cigarettes    Quit date: 07/28/1987  . Smokeless tobacco: Never Used  . Alcohol use No    Review of Systems Patient denies headaches, rhinorrhea, blurry vision, numbness, shortness of breath, chest pain, edema, cough, abdominal pain, nausea, vomiting, diarrhea, dysuria, fevers, rashes or hallucinations unless otherwise stated above in HPI. ____________________________________________   PHYSICAL EXAM:  VITAL SIGNS: Vitals:   09/11/16 1545  BP: (!) 161/57  Pulse: 73  Resp: 16  Temp:  98.2 F (36.8 C)    Constitutional: elderly, frail and in moderate resp distress Eyes: Conjunctivae are normal. PERRL. EOMI. Head: large ecchymosis and bruising to left face and chest. Nose: No congestion/rhinnorhea. Mouth/Throat: Mucous membranes are moist.  Oropharynx non-erythematous. Neck: No stridor. Painless ROM. No cervical spine tenderness to palpation Hematological/Lymphatic/Immunilogical: No cervical lymphadenopathy. Cardiovascular: Normal rate, regular rhythm. Grossly normal heart sounds.  Good peripheral circulation. Respiratory: diffuse wheezing, with inspiratory crackles,  tachypnea with + use of accessory muscles Gastrointestinal: Soft and nontender. No distention. No abdominal bruits. No CVA tenderness. Genitourinary:  Musculoskeletal: No lower extremity tenderness nor edema.  No joint effusions. Neurologic:  Normal speech and language. No gross focal neurologic deficits are appreciated. No gait instability. Skin:  Skin is warm, dry and intact. No rash noted. Psychiatric: Mood and affect are normal. Speech and behavior are normal.  ____________________________________________   LABS (all labs ordered are listed, but only abnormal results are displayed)  Results for orders placed or performed during the hospital encounter of 09/11/16 (from the past 24 hour(s))  CBC with Differential/Platelet     Status: Abnormal   Collection Time: 09/11/16  4:50 PM  Result Value Ref Range   WBC 4.2 3.6 - 11.0 K/uL   RBC 3.51 (L) 3.80 - 5.20 MIL/uL   Hemoglobin 12.0 12.0 - 16.0 g/dL   HCT 35.5 35.0 - 47.0 %   MCV 101.2 (H) 80.0 - 100.0 fL   MCH 34.1 (H) 26.0 - 34.0 pg   MCHC 33.7 32.0 - 36.0 g/dL   RDW 16.1 (H) 11.5 - 14.5 %   Platelets 99 (L) 150 - 440 K/uL   Neutrophils Relative % 66 %   Lymphocytes Relative 22 %   Monocytes Relative 10 %   Eosinophils Relative 0 %   Basophils Relative 0 %   Band Neutrophils 1 %   Metamyelocytes Relative 0 %   Myelocytes 1 %   Promyelocytes Absolute 0 %   Blasts 0 %   nRBC 0 0 /100 WBC   Other 0 %   Neutro Abs 2.9 1.4 - 6.5 K/uL   Lymphs Abs 0.9 (L) 1.0 - 3.6 K/uL   Monocytes Absolute 0.4 0.2 - 0.9 K/uL   Eosinophils Absolute 0.0 0 - 0.7 K/uL   Basophils Absolute 0.0 0 - 0.1 K/uL   Smear Review MORPHOLOGY UNREMARKABLE   Comprehensive metabolic panel     Status: Abnormal   Collection Time: 09/11/16  4:50 PM  Result Value Ref Range   Sodium 140 135 - 145 mmol/L   Potassium 4.5 3.5 - 5.1 mmol/L   Chloride 100 (L) 101 - 111 mmol/L   CO2 31 22 - 32 mmol/L   Glucose, Bld 106 (H) 65 - 99 mg/dL   BUN 77 (H) 6 - 20 mg/dL    Creatinine, Ser 3.26 (H) 0.44 - 1.00 mg/dL   Calcium 8.8 (L) 8.9 - 10.3 mg/dL   Total Protein 6.9 6.5 - 8.1 g/dL   Albumin 3.4 (L) 3.5 - 5.0 g/dL   AST 25 15 - 41 U/L   ALT 8 (L) 14 - 54 U/L   Alkaline Phosphatase 78 38 - 126 U/L   Total Bilirubin 1.1 0.3 - 1.2 mg/dL   GFR calc non Af Amer 12 (L) >60 mL/min   GFR calc Af Amer 14 (L) >60 mL/min   Anion gap 9 5 - 15  Troponin I     Status: Abnormal   Collection Time: 09/11/16  4:50 PM  Result Value Ref Range   Troponin I 0.07 (HH) <0.03 ng/mL  Brain natriuretic peptide     Status: Abnormal   Collection Time: 09/11/16  4:50 PM  Result Value Ref Range   B Natriuretic Peptide 2,764.0 (H) 0.0 - 100.0 pg/mL  Blood gas, venous     Status: Abnormal   Collection Time: 09/11/16  6:07 PM  Result Value Ref Range   pH, Ven 7.39 7.250 - 7.430   pCO2, Ven 55 44.0 - 60.0 mmHg   pO2, Ven <31.0 (LL) 32.0 - 45.0 mmHg   Bicarbonate 33.3 (H) 20.0 - 28.0 mmol/L   Acid-Base Excess 6.8 (H) 0.0 - 2.0 mmol/L   Patient temperature 37.0    Collection site VEIN    Sample type VENOUS    ____________________________________________  EKG My review and personal interpretation at Time: 16:43   Indication: sob  Rate: 75  Rhythm: a paced Axis: normal Other: limited interpretation 2/2 baseline wander, no STEMI or sgarbossa ____________________________________________  RADIOLOGY  I personally reviewed all radiographic images ordered to evaluate for the above acute complaints and reviewed radiology reports and findings.  These findings were personally discussed with the patient.  Please see medical record for radiology report.  ____________________________________________   PROCEDURES  Procedure(s) performed:  Procedures    Critical Care performed: no ____________________________________________   INITIAL IMPRESSION / ASSESSMENT AND PLAN / ED COURSE  Pertinent labs & imaging results that were available during my care of the patient were reviewed  by me and considered in my medical decision making (see chart for details).  DDX: Asthma, copd, CHF, pna, ptx, malignancy, Pe, anemia   Gloria Jarvis is a 83 y.o. who presents to the ED with chief complaint of shortness breath and chest pain. Patient very complicated past nuchal history including multiple metastatic malignancy status post right sided lobectomy. Patient also with underlying anxiety as well as reported congestive heart failure. Based on her tachypnea will provide nebulizer treatments. EKG without any evidence of obvious ischemia but limited due to pacer with underlying baseline wander. Will order a troponin to further evaluate.  The patient will be placed on continuous pulse oximetry and telemetry for monitoring.  Laboratory evaluation will be sent to evaluate for the above complaints.     Clinical Course as of Sep 11 1901  Fri Sep 11, 2016  1901 Discussed results of CT scan with patient very complex mixed picture showing evidence of probable bronchopneumonia as well as interstitial edema in the setting of her AK I. Patient without any hypoxia and therefore we'll provide very gentle IV fluid bolus. Given evidence of bronchopneumonia will order high-dose Levaquin. Do suspect that the troponin elevation is demand ischemia in the setting of congestive heart failure, AK I and dehydration. Based on her multiple comorbidities and acute medical problems to feel the patient will require admission to hospital for further evaluation and management.  [PR]    Clinical Course User Index [PR] Merlyn Lot, MD     ____________________________________________   FINAL CLINICAL IMPRESSION(S) / ED DIAGNOSES  Final diagnoses:  Chest pain, unspecified type  Bronchopneumonia  AKI (acute kidney injury) ([place])  Shortness of breath      NEW MEDICATIONS STARTED DURING THIS VISIT:  New Prescriptions   No medications on file     Note:  This document was prepared using Dragon voice  recognition software and may include unintentional dictation errors.    Merlyn Lot, MD 09/11/16 [id number]

## 2016-09-11 NOTE — Progress Notes (Addendum)
Pharmacy Antibiotic Note  Gloria Jarvis is a 83 y.o. female admitted on 09/11/2016 with PNA.  Pharmacy has been consulted for levofloxacin dosing.  Plan: Patient received levofloxacin 750 mg IV x 1 in ED. ARF with baseline 2.5 to 2.8 mg/dL, CrCL approximately 5 to 10 mL/min now. Per [facility] Antibiotic Book dose for levofloxacin with CrCl <10 mL/min not on HD is 250 to 500 mg IV Q48H.   Ordered levofloxacin 500 mg IV Q48H starting 09/13/16 at 1800.  Height: '5\' 2"'$  (157.5 cm) Weight: 86 lb 6.4 oz (39.2 kg) IBW/kg (Calculated) : 50.1  Temp (24hrs), Avg:98 F (36.7 C), Min:97.7 F (36.5 C), Max:98.2 F (36.8 C)   Recent Labs Lab 09/11/16 1650  WBC 4.2  CREATININE 3.26*    Estimated Creatinine Clearance: 7.8 mL/min (by C-G formula based on SCr of 3.26 mg/dL (H)).    Allergies  Allergen Reactions  . Ace Inhibitors Cough  . Amlodipine Diarrhea and Nausea And Vomiting  . Entresto [Sacubitril-Valsartan] Other (See Comments)    Back pain  . Furosemide Other (See Comments)    Blood sugar dropped    Thank you for allowing pharmacy to be a part of this patient's care.  Laural Benes, Pharm.D., BCPS Clinical Pharmacist 09/11/2016 10:43 PM

## 2016-09-12 ENCOUNTER — Inpatient Hospital Stay: Payer: Medicare HMO

## 2016-09-12 ENCOUNTER — Inpatient Hospital Stay
Admit: 2016-09-12 | Discharge: 2016-09-12 | Disposition: A | Discharge status: Home / Self Care | Payer: Medicare HMO | Attending: Internal Medicine | Admitting: Internal Medicine

## 2016-09-12 LAB — BASIC METABOLIC PANEL
ANION GAP: 7 (ref 5–15)
BUN: 72 mg/dL — ABNORMAL HIGH (ref 6–20)
CO2: 29 mmol/L (ref 22–32)
CREATININE: 3.14 mg/dL — AB (ref 0.44–1.00)
Calcium: 8.4 mg/dL — ABNORMAL LOW (ref 8.9–10.3)
Chloride: 106 mmol/L (ref 101–111)
GFR calc non Af Amer: 13 mL/min — ABNORMAL LOW (ref >60)
GFR, EST AFRICAN AMERICAN: 15 mL/min — AB (ref >60)
Glucose, Bld: 99 mg/dL (ref 65–99)
Potassium: 4.8 mmol/L (ref 3.5–5.1)
SODIUM: 142 mmol/L (ref 135–145)

## 2016-09-12 LAB — CBC
HCT: 33 % — ABNORMAL LOW (ref 35.0–47.0)
HEMOGLOBIN: 10.9 g/dL — AB (ref 12.0–16.0)
MCH: 34.1 pg — ABNORMAL HIGH (ref 26.0–34.0)
MCHC: 33 g/dL (ref 32.0–36.0)
MCV: 103.2 fL — AB (ref 80.0–100.0)
PLATELETS: 84 10*3/uL — AB (ref 150–440)
RBC: 3.2 MIL/uL — AB (ref 3.80–5.20)
RDW: 16.4 % — ABNORMAL HIGH (ref 11.5–14.5)
WBC: 4.4 10*3/uL (ref 3.6–11.0)

## 2016-09-12 LAB — ECHOCARDIOGRAM COMPLETE
Height: 62 in
Weight: 1355.2 oz

## 2016-09-12 LAB — BRAIN NATRIURETIC PEPTIDE: B NATRIURETIC PEPTIDE 5: 4090 pg/mL — AB (ref 0.0–100.0)

## 2016-09-12 MED ORDER — OSELTAMIVIR PHOSPHATE 30 MG PO CAPS
30.0000 mg | ORAL_CAPSULE | Freq: Once | ORAL | Status: AC
  Administered 2016-09-12: 07:00:00 30 mg via ORAL
  Filled 2016-09-12: qty 1

## 2016-09-12 MED ORDER — LEVOFLOXACIN IN D5W 500 MG/100ML IV SOLN
500.0000 mg | INTRAVENOUS | Status: DC
  Filled 2016-09-12: qty 100

## 2016-09-12 MED ORDER — ORAL CARE MOUTH RINSE
15.0000 mL | Freq: Two times a day (BID) | OROMUCOSAL | Status: DC
  Administered 2016-09-13 (×2): 15 mL via OROMUCOSAL

## 2016-09-12 NOTE — Evaluation (Signed)
Physical Therapy Evaluation Patient Details Name: Gloria Jarvis MRN: [id number] DOB: 05-25-1929 Today's Date: 09/12/2016   History of Present Illness  83 yo female with onset of fall and facial injury was admitted, has flu, bronchopneumonia, pulm edema, and heart failure.  PMHx:  cardiomegaly, ascites, lung CA, adenoma  Clinical Impression  Have evaluated pt for her needs regarding safety and progression of therapy.  Pt was unable to walk with no O2, dropping as follows: SATURATION QUALIFICATIONS: (This note is used to comply with regulatory documentation for home oxygen)  Patient Saturations on Room Air at Rest = 91%  Patient Saturations on Room Air while Ambulating = 81%  Patient Saturations on O2 while at rest:  97% Please briefly explain why patient needs home oxygen:  Will evaluate as PT goes but even without gait shows significant decline of O2 sats.    Follow Up Recommendations SNF    Equipment Recommendations  Rolling walker with 5" wheels    Recommendations for Other Services       Precautions / Restrictions Precautions Precautions: Fall (telemetry) Restrictions Weight Bearing Restrictions: No      Mobility  Bed Mobility Overal bed mobility: Needs Assistance Bed Mobility: Supine to Sit;Sit to Supine     Supine to sit: Min assist Sit to supine: Min assist      Transfers Overall transfer level: Needs assistance Equipment used: 1 person hand held assist (IV pole) Transfers: Sit to/from [facility] Sit to Stand: Min assist Stand pivot transfers: Min assist       General transfer comment: gentle guidance and assistance to power up and maneuver with no O2 per nsg approval  Ambulation/Gait Ambulation/Gait assistance: Min assist;Min guard Ambulation Distance (Feet): 30 Feet Assistive device: 1 person hand held assist (IV pole) Gait Pattern/deviations: Step-through pattern;Decreased stride length;Narrow base of support;Trunk  flexed;Shuffle Gait velocity: reduced Gait velocity interpretation: Below normal speed for age/gender (O2 sats dropped from 97% on air to 91% sitting,81% c gait) General Gait Details: slow pace with pt supporting herself on the IV rim, will need RW for home potentially  Stairs            Wheelchair Mobility    Modified Rankin (Stroke Patients Only)       Balance Overall balance assessment: History of Falls                                           Pertinent Vitals/Pain Pain Assessment: No/denies pain    Home Living Family/patient expects to be discharged to:: Private residence Living Arrangements: Alone Available Help at Discharge: Family;Available PRN/intermittently Type of Home: House       Home Layout: One level Home Equipment: None      Prior Function Level of Independence: Independent               Hand Dominance        Extremity/Trunk Assessment   Upper Extremity Assessment Upper Extremity Assessment: Generalized weakness    Lower Extremity Assessment Lower Extremity Assessment: Generalized weakness    Cervical / Trunk Assessment Cervical / Trunk Assessment: Kyphotic  Communication   Communication: No difficulties  Cognition Arousal/Alertness: Awake/alert Behavior During Therapy: WFL for tasks assessed/performed Overall Cognitive Status: No family/caregiver present to determine baseline cognitive functioning                 General Comments: pt is  not a clear historian, but did not think she used O2 at baseline nor AD    General Comments General comments (skin integrity, edema, etc.): Pt has significant bruising on her skin, has L forehead contusion and not complaining of pain    Exercises     Assessment/Plan    PT Assessment Patient needs continued PT services  PT Problem List Decreased strength;Decreased range of motion;Decreased activity tolerance;Decreased balance;Decreased mobility;Decreased  coordination;Decreased knowledge of use of DME;Decreased cognition;Decreased safety awareness;Decreased knowledge of precautions;Cardiopulmonary status limiting activity;Decreased skin integrity          PT Treatment Interventions DME instruction;Gait training;Therapeutic activities;Functional mobility training;Therapeutic exercise;Neuromuscular re-education;Balance training;Patient/family education    PT Goals (Current goals can be found in the Care Plan section)  Acute Rehab PT Goals Patient Stated Goal: to feel better and walk better PT Goal Formulation: With patient Time For Goal Achievement: 09/26/16 Potential to Achieve Goals: Good    Frequency Min 2X/week   Barriers to discharge Decreased caregiver support home alone with no clear help     Co-evaluation               End of Session Equipment Utilized During Treatment: Gait belt Activity Tolerance: Patient tolerated treatment well;Treatment limited secondary to medical complications (Comment) (O2 sats with no O2 via cannula) Patient left: in bed;with call bell/phone within reach;with bed alarm set (O2 replaced) Nurse Communication: Mobility status         Time: 9987-2158 PT Time Calculation (min) (ACUTE ONLY): 25 min   Charges:   PT Evaluation $PT Eval Moderate Complexity: 1 Procedure PT Treatments $Gait Training: 8-22 mins   PT G Codes:        Ramond Dial 2016-09-20, 1:27 PM   Mee Hives, PT MS Acute Rehab Dept. Number: [place] and [place]

## 2016-09-12 NOTE — Progress Notes (Signed)
Gloria Jarvis at [place] NAME: Gloria Jarvis    MR#:  [id number]  DATE OF BIRTH:  April 10, 1929  SUBJECTIVE:   patient Denies increased shortness of breath, chest pain or lower extremity edema.  REVIEW OF SYSTEMS:    Review of Systems  Constitutional: Negative.  Negative for chills, fever and malaise/fatigue.  HENT: Negative.  Negative for ear discharge, ear pain, hearing loss, nosebleeds and sore throat.   Eyes: Negative.  Negative for blurred vision and pain.  Respiratory: Positive for shortness of breath (at baseline). Negative for cough, hemoptysis and wheezing.   Cardiovascular: Negative.  Negative for chest pain, palpitations and leg swelling.  Gastrointestinal: Negative.  Negative for abdominal pain, blood in stool, diarrhea, nausea and vomiting.  Genitourinary: Negative.  Negative for dysuria.  Musculoskeletal: Positive for falls. Negative for back pain.  Skin: Negative.   Neurological: Negative for dizziness, tremors, speech change, focal weakness, seizures and headaches.  Endo/Heme/Allergies: Negative.  Does not bruise/bleed easily.  Psychiatric/Behavioral: Negative for depression, hallucinations and suicidal ideas.    Tolerating Diet: yes      DRUG ALLERGIES:   Allergies  Allergen Reactions  . Ace Inhibitors Cough  . Amlodipine Diarrhea and Nausea And Vomiting  . Entresto [Sacubitril-Valsartan] Other (See Comments)    Back pain  . Furosemide Other (See Comments)    Blood sugar dropped    VITALS:  Blood pressure (!) 156/33, pulse 75, temperature 98 F (36.7 C), temperature source Oral, resp. rate 18, height '5\' 2"'$  (1.575 m), weight 38.4 kg (84 lb 11.2 oz), SpO2 100 %.  PHYSICAL EXAMINATION:   Physical Exam  Constitutional: She is oriented to person, place, and time. No distress.  Thin frail  HENT:  Head: Normocephalic.  Eyes: No scleral icterus.  Neck: Normal range of motion. Neck supple. No JVD present. No tracheal  deviation present.  Cardiovascular: Normal rate, regular rhythm and normal heart sounds.  Exam reveals no gallop and no friction rub.   No murmur heard. Pulmonary/Chest: Effort normal and breath sounds normal. No respiratory distress. She has no wheezes. She has no rales. She exhibits no tenderness.  Abdominal: Soft. Bowel sounds are normal. She exhibits no distension and no mass. There is no tenderness. There is no rebound and no guarding.  Musculoskeletal: Normal range of motion. She exhibits no edema.  Neurological: She is alert and oriented to person, place, and time.  Skin: Skin is warm. No rash noted. There is erythema.  bruising under neck from previous fall  Psychiatric: Affect and judgment normal.      LABORATORY PANEL:   CBC  Recent Labs Lab 09/12/16 0505  WBC 4.4  HGB 10.9*  HCT 33.0*  PLT 84*   ------------------------------------------------------------------------------------------------------------------  Chemistries   Recent Labs Lab 09/11/16 1650 09/12/16 0505  NA 140 142  K 4.5 4.8  CL 100* 106  CO2 31 29  GLUCOSE 106* 99  BUN 77* 72*  CREATININE 3.26* 3.14*  CALCIUM 8.8* 8.4*  AST 25  --   ALT 8*  --   ALKPHOS 78  --   BILITOT 1.1  --    ------------------------------------------------------------------------------------------------------------------  Cardiac Enzymes  Recent Labs Lab 09/11/16 1650  TROPONINI 0.07*   ------------------------------------------------------------------------------------------------------------------  RADIOLOGY:  Dg Chest 2 View  Result Date: 09/11/2016 CLINICAL DATA:  83 year old female with a history of shortness of breath. Prior esophageal carcinoma EXAM: CHEST  2 VIEW COMPARISON:  01/22/2014, 12/25/2013. Most recent abdominal CT 10/30/2015 FINDINGS: Cardiomediastinal silhouette unchanged  with cardiomegaly. Calcifications of the aortic arch and descending thoracic aorta. Surgical changes projecting over  the lower mediastinum again noted. Unchanged cardiac pacing device on left chest wall with 2 leads in place. Pleural effusions at the bilateral lung bases obscuring the hemidiaphragm, blunting the costophrenic sulci. These appear unchanged dating to the comparison chest x-ray of 12/25/2013 Small nodules of the upper right lung, not appreciated on the comparison study. Persisting granuloma at the left lung base. Questionable interlobular septal thickening. No pneumothorax. Osteopenia. No displaced fracture. Multilevel degenerative changes of the spine. IMPRESSION: Small bilateral pleural effusions and associated atelectasis/ consolidation. Interlobular septal thickening and right upper lobe nodularity most likely relates to edema given the presence of the effusions, however, given the patient's known history of prior malignancy, follow-up chest x-ray may be considered in 3-4 weeks to assure resolution. Aortic atherosclerosis and cardiomegaly. Unchanged cardiac pacing device on the left chest wall. Surgical changes of the lower mediastinum. Signed, Dulcy Fanny. Earleen Newport, DO [facility] Radiology Electronically Signed   By: Corrie Mckusick D.O.   On: 09/11/2016 16:36   Ct Chest Wo Contrast  Result Date: 09/11/2016 CLINICAL DATA:  Chest pain and shortness of breath. History of lung cancer status post right lower lobectomy. EXAM: CT CHEST WITHOUT CONTRAST TECHNIQUE: Multidetector CT imaging of the chest was performed following the standard protocol without IV contrast. COMPARISON:  10/08/2014 PET-CT. 06/01/2014 CT of chest, abdomen, and pelvis. FINDINGS: Cardiovascular: Severe cardiomegaly. Moderate pericardial effusion. Aortic valvular calcification. Severe coronary artery calcification. Enlarged main pulmonary artery measuring 3.9 cm. Ascending aorta measures 3.8 cm. Aortic atherosclerosis with extensive calcification. Mediastinum/Nodes: 9 mm nodule within the left lobe of  the thyroid. Patulous esophagus containing fluid and debris. No lymphadenopathy. Lungs/Pleura: Clustered nodules and ground-glass opacities in a bronchovascular distribution with associated bronchial wall thickening and mucous plugging likely represents bronchopneumonia. Smooth interlobular septal thickening and small bilateral pleural effusions is compatible with interstitial pulmonary edema. Right lower lobectomy. Stable left lower lobe calcified granuloma. Upper Abdomen: Ascites in the perihepatic and perisplenic regions. Indeterminate 12 mm left adrenal nodule stable from 2015, probably an adenoma. Musculoskeletal: No chest wall mass or suspicious bone lesions identified. IMPRESSION: 1. Clustered nodules on glass opacities and bronchovascular distribution with bronchial thickening and mucous plugging probably representing which bronchopneumonia. Follow-up to resolution is recommended given history of pulmonary malignancy. 2. Interstitial pulmonary edema. 3. Small bilateral pleural effusions greater on the left. 4. Severe cardiomegaly. 5. Moderate pericardial effusion. 6. Aortic valvular calcification associated with aortic stenosis. 7. Severe coronary artery calcification. 8. Enlarged main pulmonary artery indicates underlying pulmonary artery hypertension. 9. Moderate emphysema. 10. Patulous and fluid-filled esophagus. 11. Perisplenic and perihepatic ascites. 12. 12 mm left adrenal nodule stable from 2015, probably adenoma. Electronically Signed   By: Kristine Garbe M.D.   On: 09/11/2016 18:28     ASSESSMENT AND PLAN:    83 year old female with a history of chronic atrial fibrillation not on anticoagulation due to fall risk, chronic systolic heart failure ejection fraction of 25% and protein calorie malnutrition who presented with shortness of breath and weakness.  1. Acute hypoxic respiratory failure in the setting of bronchopneumonia and influenza 30 mg a Tamiflu ordered today. Discussed  with patient about potential neuropsychiatric symptoms. She accepts these risks. Continue Levaquin Wean oxygen  2. Chronic systolic heart failure without signs of congestive heart failure Cardiology consultation as BNP is over 4000 Diuretics have been discontinued for now due to acute kidney injury Follow-up an  echocardiogram   3. Acute kidney injury on chronic kidney disease stage III with baseline creatinine 2.5: Creatinine has improved with gentle hydration Nephrology consultation Hold diuretics Follow-up renal ultrasound  4. Essential hypertension: Continue Coreg. Maxide discontinued for now due to acute kidney injury  5. Protein calorie malnutrition, moderate: Dietitian consult  6. Hyperlipidemia: Continue statin  PT consult for dispo  Management plans discussed with the patient and she is in agreement.  CODE STATUS: FULL  TOTAL TIME TAKING CARE OF THIS PATIENT: 30 minutes.     POSSIBLE D/C 1-2 days, DEPENDING ON CLINICAL CONDITION.   Jacqlyn Marolf M.D on 09/12/2016 at 7:58 AM  Between 7am to 6pm - Pager - [phone] After 6pm go to [email or web address] - password [email or web address] [facility]  Office  [phone]  CC: Primary care physician; Rusty Aus, MD  Note: This dictation was prepared with Dragon dictation along with smaller phrase technology. Any transcriptional errors that result from this process are unintentional.

## 2016-09-12 NOTE — Progress Notes (Signed)
Family Meeting Note  Advance Directive:no  Today a meeting took place with the Patient.   The following clinical team members were present during this meeting:MD  The following were discussed:Patient's diagnosis: AKI on CHD stage 3 Systolic heart failure malnutition , Patient's progosis: unable to determine and Goals for treatment: Full Code  Additional follow-up to be provided: advance directives while in hospital advised  Time spent during discussion:16 minutes  Gloria Summa, MD

## 2016-09-12 NOTE — Progress Notes (Signed)
Pt has been oliguric today. She has putout a total of 367m of urine. Dr. KVianne Bullspaged. Awaiting her to call back. Oncoming nurse, DCarlos American RN notified that we are awaiting call back from MD.

## 2016-09-12 NOTE — Progress Notes (Deleted)
PT Cancellation Note  Patient Details Name: [name] MRN: [id number] DOB: 10-06-1928   Cancelled Treatment:    Reason Eval/Treat Not Completed: Other (comment) (was seeing Speech therapy then eating at 10:20).  Will return later to reattempt.   Ramond Dial 09/12/2016, 11:01 AM  Mee Hives, PT MS Acute Rehab Dept. Number: [place] and [place]

## 2016-09-12 NOTE — Progress Notes (Signed)
PT Cancellation Note  Patient Details Name: Gloria Jarvis MRN: [id number] DOB: May 17, 1929   Cancelled Treatment:    Reason Eval/Treat Not Completed: Patient at procedure or test/unavailable (x 2).  Will check later when pt returns from [place].   Ramond Dial 09/12/2016, 11:38 AM   Mee Hives, PT MS Acute Rehab Dept. Number: [place] and [place]

## 2016-09-12 NOTE — Consult Note (Signed)
Reason for Consult: Shortness of breath congestive heart failure  Referring Physician:  is Dr. Emily Filbert primary , Dr.Kalisetti hospitalist  : Gloria Jarvis is an 83 y.o. female.  HPIpatient presents with dyspnea shortness of breath history of atrial fibrillation permanent pacemaker in place not on anticoagulation GERD hypertension malnutrition multiple falls with generalized weakness complains of worsening breathing. Patient is a poor historian failure 2 weeks ago injuring her face and forehead with a persistent ecchymotic hematoma. The patient was on Lasix and metolazone at home patient ID decreased energy and decreased weight of 10 pounds over the last few weeks does not remember O2. Patient has been having chest pain with shortness of breath troponin was slightly elevated CT of the chest showed pneumonia so the patient was admitted and cardiology was called for further assessment and evaluation  Past Medical History:  Diagnosis Date  . Anemia   . Anxiety   . Arthritis   . Congestive heart failure (CHF) ([place])   . Depressed   . Esophageal cancer ([place])   . GERD (gastroesophageal reflux disease)   . Hyperlipidemia   . Hypertension   . Lung cancer ([place])   . Renal insufficiency   . Syncope and collapse     Past Surgical History:  Procedure Laterality Date  . CARDIAC CATHETERIZATION    . CAROTID ENDARTERECTOMY    . ESOPHAGEAL DILATION    . INSERT / REPLACE / REMOVE PACEMAKER    . PARTIAL HYSTERECTOMY    . WRIST FRACTURE SURGERY      Family History  Problem Relation Age of Onset  . Heart disease Mother   . Heart disease Father   . Stroke Father   . COPD Sister     Social History:  reports that she quit smoking about 29 years ago. Her smoking use included Cigarettes. She has a 40.00 pack-year smoking history. She has never used smokeless tobacco. She reports that she does not drink alcohol or use drugs.  Allergies:  Allergies  Allergen Reactions  . Ace Inhibitors Cough  .  Amlodipine Diarrhea and Nausea And Vomiting  . Entresto [Sacubitril-Valsartan] Other (See Comments)    Back pain  . Furosemide Other (See Comments)    Blood sugar dropped    Medications: I have reviewed the patient's current medications.  Results for orders placed or performed during the hospital encounter of 09/11/16 (from the past 48 hour(s))  CBC with Differential/Platelet     Status: Abnormal   Collection Time: 09/11/16  4:50 PM  Result Value Ref Range   WBC 4.2 3.6 - 11.0 K/uL   RBC 3.51 (L) 3.80 - 5.20 MIL/uL   Hemoglobin 12.0 12.0 - 16.0 g/dL   HCT 35.5 35.0 - 47.0 %   MCV 101.2 (H) 80.0 - 100.0 fL   MCH 34.1 (H) 26.0 - 34.0 pg   MCHC 33.7 32.0 - 36.0 g/dL   RDW 16.1 (H) 11.5 - 14.5 %   Platelets 99 (L) 150 - 440 K/uL   Neutrophils Relative % 66 %   Lymphocytes Relative 22 %   Monocytes Relative 10 %   Eosinophils Relative 0 %   Basophils Relative 0 %   Band Neutrophils 1 %   Metamyelocytes Relative 0 %   Myelocytes 1 %   Promyelocytes Absolute 0 %   Blasts 0 %   nRBC 0 0 /100 WBC   Other 0 %   Neutro Abs 2.9 1.4 - 6.5 K/uL   Lymphs Abs 0.9 (L)  1.0 - 3.6 K/uL   Monocytes Absolute 0.4 0.2 - 0.9 K/uL   Eosinophils Absolute 0.0 0 - 0.7 K/uL   Basophils Absolute 0.0 0 - 0.1 K/uL   Smear Review MORPHOLOGY UNREMARKABLE   Comprehensive metabolic panel     Status: Abnormal   Collection Time: 09/11/16  4:50 PM  Result Value Ref Range   Sodium 140 135 - 145 mmol/L   Potassium 4.5 3.5 - 5.1 mmol/L   Chloride 100 (L) 101 - 111 mmol/L   CO2 31 22 - 32 mmol/L   Glucose, Bld 106 (H) 65 - 99 mg/dL   BUN 77 (H) 6 - 20 mg/dL   Creatinine, Ser 3.26 (H) 0.44 - 1.00 mg/dL   Calcium 8.8 (L) 8.9 - 10.3 mg/dL   Total Protein 6.9 6.5 - 8.1 g/dL   Albumin 3.4 (L) 3.5 - 5.0 g/dL   AST 25 15 - 41 U/L   ALT 8 (L) 14 - 54 U/L   Alkaline Phosphatase 78 38 - 126 U/L   Total Bilirubin 1.1 0.3 - 1.2 mg/dL   GFR calc non Af Amer 12 (L) >60 mL/min   GFR calc Af Amer 14 (L) >60 mL/min     Comment: (NOTE) The eGFR has been calculated using the CKD EPI equation. This calculation has not been validated in all clinical situations. eGFR's persistently <60 mL/min signify possible Chronic Kidney Disease.    Anion gap 9 5 - 15  Troponin I     Status: Abnormal   Collection Time: 09/11/16  4:50 PM  Result Value Ref Range   Troponin I 0.07 (HH) <0.03 ng/mL    Comment: CRITICAL RESULT CALLED TO, READ BACK BY AND VERIFIED WITH STEVEN JONES @ 2993 09/11/16 BY [facility]   Brain natriuretic peptide     Status: Abnormal   Collection Time: 09/11/16  4:50 PM  Result Value Ref Range   B Natriuretic Peptide 2,764.0 (H) 0.0 - 100.0 pg/mL  Blood gas, venous     Status: Abnormal   Collection Time: 09/11/16  6:07 PM  Result Value Ref Range   pH, Ven 7.39 7.250 - 7.430   pCO2, Ven 55 44.0 - 60.0 mmHg   pO2, Ven <31.0 (LL) 32.0 - 45.0 mmHg    Comment: VENOUS   Bicarbonate 33.3 (H) 20.0 - 28.0 mmol/L   Acid-Base Excess 6.8 (H) 0.0 - 2.0 mmol/L   Patient temperature 37.0    Collection site VEIN    Sample type VENOUS   Influenza panel by PCR (type A & B)     Status: Abnormal   Collection Time: 09/11/16  8:20 PM  Result Value Ref Range   Influenza A By PCR POSITIVE (A) NEGATIVE   Influenza B By PCR NEGATIVE NEGATIVE    Comment: (NOTE) The Xpert Xpress Flu assay is intended as an aid in the diagnosis of  influenza and should not be used as a sole basis for treatment.  This  assay is FDA approved for nasopharyngeal swab specimens only. Nasal  washings and aspirates are unacceptable for Xpert Xpress Flu testing.   Brain natriuretic peptide     Status: Abnormal   Collection Time: 09/12/16  5:05 AM  Result Value Ref Range   B Natriuretic Peptide 4,090.0 (H) 0.0 - 100.0 pg/mL  Basic metabolic panel     Status: Abnormal   Collection Time: 09/12/16  5:05 AM  Result Value Ref Range   Sodium 142 135 - 145 mmol/L   Potassium 4.8 3.5 -  5.1 mmol/L   Chloride 106 101 - 111 mmol/L   CO2 29 22 - 32  mmol/L   Glucose, Bld 99 65 - 99 mg/dL   BUN 72 (H) 6 - 20 mg/dL   Creatinine, Ser 3.14 (H) 0.44 - 1.00 mg/dL   Calcium 8.4 (L) 8.9 - 10.3 mg/dL   GFR calc non Af Amer 13 (L) >60 mL/min   GFR calc Af Amer 15 (L) >60 mL/min    Comment: (NOTE) The eGFR has been calculated using the CKD EPI equation. This calculation has not been validated in all clinical situations. eGFR's persistently <60 mL/min signify possible Chronic Kidney Disease.    Anion gap 7 5 - 15  CBC     Status: Abnormal   Collection Time: 09/12/16  5:05 AM  Result Value Ref Range   WBC 4.4 3.6 - 11.0 K/uL   RBC 3.20 (L) 3.80 - 5.20 MIL/uL   Hemoglobin 10.9 (L) 12.0 - 16.0 g/dL   HCT 33.0 (L) 35.0 - 47.0 %   MCV 103.2 (H) 80.0 - 100.0 fL   MCH 34.1 (H) 26.0 - 34.0 pg   MCHC 33.0 32.0 - 36.0 g/dL   RDW 16.4 (H) 11.5 - 14.5 %   Platelets 84 (L) 150 - 440 K/uL    Dg Chest 2 View  Result Date: 09/11/2016 CLINICAL DATA:  83 year old female with a history of shortness of breath. Prior esophageal carcinoma EXAM: CHEST  2 VIEW COMPARISON:  01/22/2014, 12/25/2013. Most recent abdominal CT 10/30/2015 FINDINGS: Cardiomediastinal silhouette unchanged with cardiomegaly. Calcifications of the aortic arch and descending thoracic aorta. Surgical changes projecting over the lower mediastinum again noted. Unchanged cardiac pacing device on left chest wall with 2 leads in place. Pleural effusions at the bilateral lung bases obscuring the hemidiaphragm, blunting the costophrenic sulci. These appear unchanged dating to the comparison chest x-ray of 12/25/2013 Small nodules of the upper right lung, not appreciated on the comparison study. Persisting granuloma at the left lung base. Questionable interlobular septal thickening. No pneumothorax. Osteopenia. No displaced fracture. Multilevel degenerative changes of the spine. IMPRESSION: Small bilateral pleural effusions and associated atelectasis/ consolidation. Interlobular septal thickening and  right upper lobe nodularity most likely relates to edema given the presence of the effusions, however, given the patient's known history of prior malignancy, follow-up chest x-ray may be considered in 3-4 weeks to assure resolution. Aortic atherosclerosis and cardiomegaly. Unchanged cardiac pacing device on the left chest wall. Surgical changes of the lower mediastinum. Signed, Dulcy Fanny. Earleen Newport, DO [facility] Electronically Signed   By: Corrie Mckusick D.O.   On: 09/11/2016 16:36   Ct Chest Wo Contrast  Result Date: 09/11/2016 CLINICAL DATA:  Chest pain and shortness of breath. History of lung cancer status post right lower lobectomy. EXAM: CT CHEST WITHOUT CONTRAST TECHNIQUE: Multidetector CT imaging of the chest was performed following the standard protocol without IV contrast. COMPARISON:  10/08/2014 PET-CT. 06/01/2014 CT of chest, abdomen, and pelvis. FINDINGS: Cardiovascular: Severe cardiomegaly. Moderate pericardial effusion. Aortic valvular calcification. Severe coronary artery calcification. Enlarged main pulmonary artery measuring 3.9 cm. Ascending aorta measures 3.8 cm. Aortic atherosclerosis with extensive calcification. Mediastinum/Nodes: 9 mm nodule within the left lobe of the thyroid. Patulous esophagus containing fluid and debris. No lymphadenopathy. Lungs/Pleura: Clustered nodules and ground-glass opacities in a bronchovascular distribution with associated bronchial wall thickening and mucous plugging likely represents bronchopneumonia. Smooth interlobular septal thickening and small bilateral pleural effusions is compatible with interstitial pulmonary edema. Right  lower lobectomy. Stable left lower lobe calcified granuloma. Upper Abdomen: Ascites in the perihepatic and perisplenic regions. Indeterminate 12 mm left adrenal nodule stable from 2015, probably an adenoma. Musculoskeletal: No chest wall mass or suspicious bone lesions  identified. IMPRESSION: 1. Clustered nodules on glass opacities and bronchovascular distribution with bronchial thickening and mucous plugging probably representing which bronchopneumonia. Follow-up to resolution is recommended given history of pulmonary malignancy. 2. Interstitial pulmonary edema. 3. Small bilateral pleural effusions greater on the left. 4. Severe cardiomegaly. 5. Moderate pericardial effusion. 6. Aortic valvular calcification associated with aortic stenosis. 7. Severe coronary artery calcification. 8. Enlarged main pulmonary artery indicates underlying pulmonary artery hypertension. 9. Moderate emphysema. 10. Patulous and fluid-filled esophagus. 11. Perisplenic and perihepatic ascites. 12. 12 mm left adrenal nodule stable from 2015, probably adenoma. Electronically Signed   By: Kristine Garbe M.D.   On: 09/11/2016 18:28    Review of Systems  Constitutional: Positive for malaise/fatigue.  HENT: Negative.   Eyes: Negative.   Respiratory: Negative.   Cardiovascular: Negative.   Gastrointestinal: Negative.   Genitourinary: Negative.   Musculoskeletal: Positive for falls and myalgias.  Skin: Negative.   Neurological: Positive for weakness.  Endo/Heme/Allergies: Negative.   Psychiatric/Behavioral: Negative.    Blood pressure (!) 156/33, pulse 75, temperature 98 F (36.7 C), temperature source Oral, resp. rate 18, height _0  (1.575 m), weight 38.4 kg (84 lb 11.2 oz), SpO2 100 %. Physical Exam  Nursing note and vitals reviewed. Constitutional: She is oriented to person, place, and time. She appears well-developed and well-nourished.  HENT:  Head: Normocephalic and atraumatic.  Eyes: Conjunctivae and EOM are normal. Pupils are equal, round, and reactive to light.  Neck: Normal range of motion. Neck supple.  Cardiovascular: Normal rate and regular rhythm.   Murmur heard. Respiratory: Effort normal and breath sounds normal.  GI: Soft. Bowel sounds are normal.   Musculoskeletal: Normal range of motion.  Neurological: She is alert and oriented to person, place, and time. She has normal reflexes.  Skin: Skin is warm and dry.  Psychiatric: She has a normal mood and affect.    Assessment/Plan: Shortness of breath Respiratory failure  Dyspnea Sick sinus syndrome with permanent pacemaker Atrial fibrillation Falls multiple Pneumonia Elevated troponin GERD Hyperlipidemia Chest pain . PLAN Agree with admission for evaluation Consider social service consult for possible placement Continue Protonix for reflux symptoms Agree with Coreg and hydralazine for hypertension Levaquin for pneumonia Pravachol therapy for lipid management Atrial fibrillation rate control with Coreg Do not recommend anticoagulation because of risk because of falls DVT prophylaxis Recommend physical therapy to help with gait training and balance  Dwayne D Callwood 09/12/2016, 11:02 AM

## 2016-09-13 LAB — BASIC METABOLIC PANEL
ANION GAP: 5 (ref 5–15)
BUN: 63 mg/dL — ABNORMAL HIGH (ref 6–20)
CHLORIDE: 108 mmol/L (ref 101–111)
CO2: 28 mmol/L (ref 22–32)
Calcium: 8.1 mg/dL — ABNORMAL LOW (ref 8.9–10.3)
Creatinine, Ser: 2.92 mg/dL — ABNORMAL HIGH (ref 0.44–1.00)
GFR calc Af Amer: 16 mL/min — ABNORMAL LOW (ref >60)
GFR calc non Af Amer: 14 mL/min — ABNORMAL LOW (ref >60)
Glucose, Bld: 103 mg/dL — ABNORMAL HIGH (ref 65–99)
Potassium: 4.8 mmol/L (ref 3.5–5.1)
Sodium: 141 mmol/L (ref 135–145)

## 2016-09-13 MED ORDER — OSELTAMIVIR PHOSPHATE 30 MG PO CAPS
30.0000 mg | ORAL_CAPSULE | Freq: Every day | ORAL | Status: DC
  Administered 2016-09-13 – 2016-09-14 (×2): 30 mg via ORAL
  Filled 2016-09-13 (×2): qty 1

## 2016-09-13 MED ORDER — LEVOFLOXACIN 500 MG PO TABS
500.0000 mg | ORAL_TABLET | ORAL | Status: DC
  Administered 2016-09-13: 500 mg via ORAL
  Filled 2016-09-13: qty 1

## 2016-09-13 MED ORDER — ENSURE ENLIVE PO LIQD
237.0000 mL | Freq: Two times a day (BID) | ORAL | Status: DC
  Administered 2016-09-13 (×2): 237 mL via ORAL

## 2016-09-13 MED ORDER — BOOST / RESOURCE BREEZE PO LIQD
1.0000 | Freq: Three times a day (TID) | ORAL | Status: DC
  Administered 2016-09-13 – 2016-09-14 (×4): 1 via ORAL

## 2016-09-13 NOTE — Progress Notes (Signed)
Pt up to the chair and tolerating it well.

## 2016-09-13 NOTE — Progress Notes (Signed)
Pharmacy Antibiotic Note  Gloria Jarvis is a 83 y.o. female admitted on 09/11/2016 with PNA.  Pharmacy has been consulted for levofloxacin dosing.  Plan: Patient received levofloxacin '750mg'$  IV x 1 on 2/16 and continued on levofloxacin '500mg'$  IV Q48hr. Will continue patient on levofloxacin '500mg'$  PO Q48hr.    Height: '5\' 2"'$  (157.5 cm) Weight: 86 lb 1.6 oz (39.1 kg) IBW/kg (Calculated) : 50.1  Temp (24hrs), Avg:97.9 F (36.6 C), Min:97.8 F (36.6 C), Max:98.1 F (36.7 C)   Recent Labs Lab 09/11/16 1650 09/12/16 0505 09/13/16 0500  WBC 4.2 4.4  --   CREATININE 3.26* 3.14* 2.92*    Estimated Creatinine Clearance: 8.7 mL/min (by C-G formula based on SCr of 2.92 mg/dL (H)).    Allergies  Allergen Reactions  . Ace Inhibitors Cough  . Amlodipine Diarrhea and Nausea And Vomiting  . Entresto [Sacubitril-Valsartan] Other (See Comments)    Back pain  . Furosemide Other (See Comments)    Blood sugar dropped   Antibiotics:  Levofloxacin 2/16 >> Oseltamivir 2/17 >> 2/17  Microbiology:  2/16 BCx: no growth x 2 days  2/16 Influenza A: Positive  2/16 Influenza B: Negative   Pharmacy will continue to monitor and adjust per consult.    Daylan Boggess L 09/13/2016 8:10 AM

## 2016-09-13 NOTE — Progress Notes (Signed)
Initial Nutrition Assessment  DOCUMENTATION CODES:   Severe malnutrition in context of chronic illness  INTERVENTION:  1. Boost Breeze po TID, each supplement provides 250 kcal and 9 grams of protein  NUTRITION DIAGNOSIS:   Malnutrition related to chronic illness as evidenced by severe depletion of muscle mass, severe depletion of body fat, percent weight loss.  GOAL:   Patient will meet greater than or equal to 90% of their needs  MONITOR:   PO intake, I & O's, Labs, Weight trends, Supplement acceptance  REASON FOR ASSESSMENT:   Consult Assessment of nutrition requirement/status  ASSESSMENT:   Gloria Jarvis  is a 83 y.o. female with a known history of Atrial fibrillation not on any anticoagulation, GERD, hypertension, congestive heart failure and malnutrition presents to hospital secondary to chest pain, difficulty breathing and weakness. Patient is a poor historian.  Spoke with Ms. Pinnock at bedside. She reports ok appetite PTA "I eat whatever I want." Did not go into further detail. She exhibits a 14#/14% severe wt loss over 15 days - states it is related to her fall. Believes she can go back home after admission and continue to live alone. Complained a lot about food. Documented PO 75% yesterday. Ate ~50% of meal today. Denies any nausea/vomiting/diarrhea/constipation Does not drink supplements at home but was open to try boost breeze during admission. 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 Nutrition-Focused physical exam completed. Findings are severe fat depletion, severe muscle depletion, and no edema.   Labs and medications reviewed: Mag-Ox  Diet Order:  Diet Heart Room service appropriate? Yes; Fluid consistency: Thin  Skin:  Reviewed, no issues  Last BM:  09/11/2016  Height:   Ht Readings from Last 1 Encounters:  09/11/16 '5\' 2"'$  (1.575 m)    Weight:   Wt Readings from Last 1 Encounters:  09/13/16 86 lb 1.6 oz (39.1 kg)     Ideal Body Weight:  50 kg  BMI:  Body mass index is 15.75 kg/m.  Estimated Nutritional Needs:   Kcal:  [phone] calories  Protein:  39-47 gm  Fluid:  >/= 1L  EDUCATION NEEDS:   No education needs identified at this time  Satira Anis. Tishana Clinkenbeard, MS, RD LDN Inpatient Clinical Dietitian Pager [phone]

## 2016-09-13 NOTE — Consult Note (Addendum)
Date: 09/13/2016                  Patient Name:  Gloria Jarvis  MRN: [id number]  DOB: November 06, 1928  Age / Sex: 83 y.o., female         PCP: Rusty Aus, MD                 Service Requesting Consult: Internal Medicine                 Reason for Consult: ARF            History of Present Illness: Patient is a 83 y.o. female with medical problems of Atrial fibrillation, chronic systolic congestive heart failure, chronic kidney who was admitted to [facility] on 09/11/2016 for evaluation post fall.  Patient states that she is fairly independent in her living.  She is able to drive.  She goes to the gym.  This time, she fell from the steps of her gym and hit her head against the floor.  She has a large hematoma on the left forehead and some ecchymosis over left side of the face as well as on the chest. Nephrology consult was requested to evaluate for acute renal failure.  Patient's baseline creatinine is 1.80 from January 2016.  Admission creatinine was 2.6 which has improved to 2.92  She states she is able to eat and drink without any problem.  No nausea, vomiting or fevers.  Medications: Outpatient medications: Prescriptions Prior to Admission  Medication Sig Dispense Refill Last Dose  . acetaminophen (TYLENOL) 500 MG tablet Take 500 mg by mouth every 6 (six) hours as needed for headache.   PRN at PRN  . carvedilol (COREG) 12.5 MG tablet take 1 tablet by mouth twice a day 60 tablet 5 09/10/2016 at 2000  . magnesium oxide (MAG-OX) 400 (241.3 Mg) MG tablet Take 400 mg by mouth daily.  5 09/10/2016 at 0800  . omeprazole (PRILOSEC) 20 MG capsule Take 20 mg by mouth 2 (two) times daily before a meal.   09/10/2016 at 2000  . pravastatin (PRAVACHOL) 20 MG tablet Take 20 mg by mouth daily.   09/10/2016 at 2000  . triamterene-hydrochlorothiazide (MAXZIDE-25) 37.5-25 MG tablet Take 1 tablet by mouth daily.   09/10/2016 at 0800    Current medications: Current Facility-Administered Medications  Medication  Dose Route Frequency Provider Last Rate Last Dose  . acetaminophen (TYLENOL) tablet 650 mg  650 mg Oral Q6H PRN Gladstone Lighter, MD   650 mg at 09/13/16 0000   Or  . acetaminophen (TYLENOL) suppository 650 mg  650 mg Rectal Q6H PRN Gladstone Lighter, MD      . carvedilol (COREG) tablet 12.5 mg  12.5 mg Oral BID WC Gladstone Lighter, MD   12.5 mg at 09/13/16 0829  . feeding supplement (ENSURE ENLIVE) (ENSURE ENLIVE) liquid 237 mL  237 mL Oral BID BM Sital Mody, MD   237 mL at 09/13/16 1000  . heparin injection 5,000 Units  5,000 Units Subcutaneous Q8H Gladstone Lighter, MD   5,000 Units at 09/13/16 0534  . hydrALAZINE (APRESOLINE) injection 10 mg  10 mg Intravenous Q6H PRN Gladstone Lighter, MD      . levofloxacin (LEVAQUIN) tablet 500 mg  500 mg Oral Q48H Sital Mody, MD      . magnesium oxide (MAG-OX) tablet 400 mg  400 mg Oral Daily Gladstone Lighter, MD   400 mg at 09/13/16 0829  . MEDLINE mouth rinse  15 mL  Mouth Rinse BID Bettey Costa, MD   15 mL at 09/13/16 1000  . ondansetron (ZOFRAN) tablet 4 mg  4 mg Oral Q6H PRN Gladstone Lighter, MD       Or  . ondansetron (ZOFRAN) injection 4 mg  4 mg Intravenous Q6H PRN Gladstone Lighter, MD      . oseltamivir (TAMIFLU) capsule 30 mg  30 mg Oral Daily Sital Mody, MD      . pantoprazole (PROTONIX) EC tablet 40 mg  40 mg Oral BID AC Gladstone Lighter, MD   40 mg at 09/13/16 0829  . pravastatin (PRAVACHOL) tablet 20 mg  20 mg Oral Daily Gladstone Lighter, MD   20 mg at 09/13/16 0829  . sodium chloride flush (NS) 0.9 % injection 3 mL  3 mL Intravenous Q12H Gladstone Lighter, MD   3 mL at 09/13/16 1000      Allergies: Allergies  Allergen Reactions  . Ace Inhibitors Cough  . Amlodipine Diarrhea and Nausea And Vomiting  . Entresto [Sacubitril-Valsartan] Other (See Comments)    Back pain  . Furosemide Other (See Comments)    Blood sugar dropped      Past Medical History: Past Medical History:  Diagnosis Date  . Anemia   . Anxiety   .  Arthritis   . Congestive heart failure (CHF) ([place])   . Depressed   . Esophageal cancer ([place])   . GERD (gastroesophageal reflux disease)   . Hyperlipidemia   . Hypertension   . Lung cancer ([place])   . Renal insufficiency   . Syncope and collapse      Past Surgical History: Past Surgical History:  Procedure Laterality Date  . CARDIAC CATHETERIZATION    . CAROTID ENDARTERECTOMY    . ESOPHAGEAL DILATION    . INSERT / REPLACE / REMOVE PACEMAKER    . PARTIAL HYSTERECTOMY    . WRIST FRACTURE SURGERY       Family History: Family History  Problem Relation Age of Onset  . Heart disease Mother   . Heart disease Father   . Stroke Father   . COPD Sister      Social History: Social History   Social History  . Marital status: Divorced    Spouse name: N/A  . Number of children: N/A  . Years of education: N/A   Occupational History  . Not on file.   Social History Main Topics  . Smoking status: Former Smoker    Packs/day: 1.00    Years: 40.00    Types: Cigarettes    Quit date: 07/28/1987  . Smokeless tobacco: Never Used  . Alcohol use No  . Drug use: No  . Sexual activity: Not on file   Other Topics Concern  . Not on file   Social History Narrative   Lives at home by herself. Has a walker to ambulate     Review of Systems: Gen: No fevers or chills, has some weight loss HEENT: no vision or hearing problems CV: some shortness of breath Resp: cough, nonproductive GI:no nausea, vomiting or diarrhea GU : no problems with voiding MS: able to walk and drive. Derm:  no complaints Psych:no complaints Heme: no complaints Neuro: no complaints Endocrine.  No complaints  Vital Signs: Blood pressure (!) 142/33, pulse 66, temperature 97.8 F (36.6 C), temperature source Oral, resp. rate 20, height '5\' 2"'$  (1.575 m), weight 39.1 kg (86 lb 1.6 oz), SpO2 99 %.   Intake/Output Summary (Last 24 hours) at 09/13/16 1110 Last data filed  at 09/13/16 0538  Gross per 24 hour   Intake          1321.67 ml  Output              400 ml  Net           921.67 ml    Weight trends: [employer]   09/11/16 2207 09/12/16 0500 09/13/16 0500  Weight: 39.2 kg (86 lb 6.4 oz) 38.4 kg (84 lb 11.2 oz) 39.1 kg (86 lb 1.6 oz)    Physical Exam: General: Thin, small frame, laying in the bed  HEENT Anicteric, moist oral mucous membranes, left forehand hematoma, left periorbital ecchymosis  Neck:  supple  Lungs: Mild diffuse crackles  Heart:: Irregular rhythm  Abdomen: Soft, nontender  Extremities:  no edema  Neurologic: aLert, oriented  Skin: dry             Lab results: Basic Metabolic Panel:  Recent Labs Lab 09/11/16 1650 09/12/16 0505 09/13/16 0500  NA 140 142 141  K 4.5 4.8 4.8  CL 100* 106 108  CO2 '31 29 28  '$ GLUCOSE 106* 99 103*  BUN 77* 72* 63*  CREATININE 3.26* 3.14* 2.92*  CALCIUM 8.8* 8.4* 8.1*    Liver Function Tests:  Recent Labs Lab 09/11/16 1650  AST 25  ALT 8*  ALKPHOS 78  BILITOT 1.1  PROT 6.9  ALBUMIN 3.4*   No results for input(s): LIPASE, AMYLASE in the last 168 hours. No results for input(s): AMMONIA in the last 168 hours.  CBC:  Recent Labs Lab 09/11/16 1650 09/12/16 0505  WBC 4.2 4.4  NEUTROABS 2.9  --   HGB 12.0 10.9*  HCT 35.5 33.0*  MCV 101.2* 103.2*  PLT 99* 84*    Cardiac Enzymes:  Recent Labs Lab 09/11/16 1650  TROPONINI 0.07*    BNP: Invalid input(s): POCBNP  CBG: No results for input(s): GLUCAP in the last 168 hours.  Microbiology: Recent Results (from the past 720 hour(s))  Blood culture (routine x 2)     Status: None (Preliminary result)   Collection Time: 09/11/16  8:20 PM  Result Value Ref Range Status   Specimen Description BLOOD RIGHT ANTECUBITAL  Final   Special Requests   Final    BOTTLES DRAWN AEROBIC AND ANAEROBIC 12CCAERO,11CCANA,BCHV BLOOD CULTURE VOLUME HIGH   Culture NO GROWTH 2 DAYS  Final   Report Status PENDING  Incomplete  Blood culture (routine x 2)     Status: None  (Preliminary result)   Collection Time: 09/11/16  8:20 PM  Result Value Ref Range Status   Specimen Description BLOOD LEFT ANTECUBITAL  Final   Special Requests   Final    BOTTLES DRAWN AEROBIC AND ANAEROBIC 13CCAERO,11CCANA,BCHV BLOOD CULTURE VOLUME HIGH   Culture NO GROWTH 2 DAYS  Final   Report Status PENDING  Incomplete     Coagulation Studies: No results for input(s): LABPROT, INR in the last 72 hours.  Urinalysis: No results for input(s): COLORURINE, LABSPEC, PHURINE, GLUCOSEU, HGBUR, BILIRUBINUR, KETONESUR, PROTEINUR, UROBILINOGEN, NITRITE, LEUKOCYTESUR in the last 72 hours.  Invalid input(s): APPERANCEUR      Imaging: Dg Chest 2 View  Result Date: 09/11/2016 CLINICAL DATA:  83 year old female with a history of shortness of breath. Prior esophageal carcinoma EXAM: CHEST  2 VIEW COMPARISON:  01/22/2014, 12/25/2013. Most recent abdominal CT 10/30/2015 FINDINGS: Cardiomediastinal silhouette unchanged with cardiomegaly. Calcifications of the aortic arch and descending thoracic aorta. Surgical changes projecting over the lower mediastinum again noted. Unchanged cardiac  pacing device on left chest wall with 2 leads in place. Pleural effusions at the bilateral lung bases obscuring the hemidiaphragm, blunting the costophrenic sulci. These appear unchanged dating to the comparison chest x-ray of 12/25/2013 Small nodules of the upper right lung, not appreciated on the comparison study. Persisting granuloma at the left lung base. Questionable interlobular septal thickening. No pneumothorax. Osteopenia. No displaced fracture. Multilevel degenerative changes of the spine. IMPRESSION: Small bilateral pleural effusions and associated atelectasis/ consolidation. Interlobular septal thickening and right upper lobe nodularity most likely relates to edema given the presence of the effusions, however, given the patient's known history of prior malignancy, follow-up chest x-ray may be considered in 3-4  weeks to assure resolution. Aortic atherosclerosis and cardiomegaly. Unchanged cardiac pacing device on the left chest wall. Surgical changes of the lower mediastinum. Signed, Dulcy Fanny. Earleen Newport, DO [facility] Electronically Signed   By: Corrie Mckusick D.O.   On: 09/11/2016 16:36   Ct Chest Wo Contrast  Result Date: 09/11/2016 CLINICAL DATA:  Chest pain and shortness of breath. History of lung cancer status post right lower lobectomy. EXAM: CT CHEST WITHOUT CONTRAST TECHNIQUE: Multidetector CT imaging of the chest was performed following the standard protocol without IV contrast. COMPARISON:  10/08/2014 PET-CT. 06/01/2014 CT of chest, abdomen, and pelvis. FINDINGS: Cardiovascular: Severe cardiomegaly. Moderate pericardial effusion. Aortic valvular calcification. Severe coronary artery calcification. Enlarged main pulmonary artery measuring 3.9 cm. Ascending aorta measures 3.8 cm. Aortic atherosclerosis with extensive calcification. Mediastinum/Nodes: 9 mm nodule within the left lobe of the thyroid. Patulous esophagus containing fluid and debris. No lymphadenopathy. Lungs/Pleura: Clustered nodules and ground-glass opacities in a bronchovascular distribution with associated bronchial wall thickening and mucous plugging likely represents bronchopneumonia. Smooth interlobular septal thickening and small bilateral pleural effusions is compatible with interstitial pulmonary edema. Right lower lobectomy. Stable left lower lobe calcified granuloma. Upper Abdomen: Ascites in the perihepatic and perisplenic regions. Indeterminate 12 mm left adrenal nodule stable from 2015, probably an adenoma. Musculoskeletal: No chest wall mass or suspicious bone lesions identified. IMPRESSION: 1. Clustered nodules on glass opacities and bronchovascular distribution with bronchial thickening and mucous plugging probably representing which bronchopneumonia. Follow-up to resolution is  recommended given history of pulmonary malignancy. 2. Interstitial pulmonary edema. 3. Small bilateral pleural effusions greater on the left. 4. Severe cardiomegaly. 5. Moderate pericardial effusion. 6. Aortic valvular calcification associated with aortic stenosis. 7. Severe coronary artery calcification. 8. Enlarged main pulmonary artery indicates underlying pulmonary artery hypertension. 9. Moderate emphysema. 10. Patulous and fluid-filled esophagus. 11. Perisplenic and perihepatic ascites. 12. 12 mm left adrenal nodule stable from 2015, probably adenoma. Electronically Signed   By: Kristine Garbe M.D.   On: 09/11/2016 18:28   US Renal  Result Date: 09/12/2016 CLINICAL DATA:  Acute renal failure. EXAM: RENAL / URINARY TRACT ULTRASOUND COMPLETE COMPARISON:  CT abdomen pelvis 10/30/2015 FINDINGS: Right Kidney: Length: 8.3 cm. Renal cortical thinning. Increased renal cortical echogenicity. No hydronephrosis. There is a 2.3 x 1.9 x 2.5 cm cyst within the interpolar region the right kidney and a 1.1 x 0.9 x 1.0 cm cyst within the inferior pole of the right kidney. Left Kidney: Length: 7.9 cm. Renal cortical thinning. Increased renal cortical echogenicity. Minimal splitting of the renal pelvis. No frank hydronephrosis. Bladder: Appears normal for degree of bladder distention. IMPRESSION: No frank hydronephrosis.  Minimal fullness of the left renal pelvis. Renal cortical thinning and increased renal cortical echogenicity as can be seen with chronic medical renal disease. Electronically  Signed   By: Lovey Newcomer M.D.   On: 09/12/2016 11:07      Assessment & Plan: Pt is a 83 y.o. caucasian female with a PMHX of atrial fibrillation,GERD, hypertension, chronic systolic congestive heart failure, chronic kidney disease, history of esophageal cancer status post surgery, was admitted on 09/11/2016 status post fall  1.  Acute renal failure on chronic kidney disease stage III 2.  SOB - Pneumonia, Influenza A  and Acute pulmonary edema  3. B/l pleural effusions 4.  Severe cardiomegaly 5.  Moderate pericardial effusion 6.  Aortic calcification of aortic stenosis and severe coronary calcification 7.  Emphysema 8. Pulmonary hypertension- presumed due to enlarged pulmonary artery seen on CT  Acute renal failure is likely a combination of ATN from pneumonia, influenza Patient has several cardiopulmonary physiologic abnormalities including cardiomegaly, pericardial effusion, aortic stenosis, emphysema and probably pulmonary hypertension  At present, her serum creatinine started to improve.  She is able to have adequate oral intake therefore we will discontinue IV fluid supplementation.  Continue to follow electrolytes and serum creatinine closely.  No acute indication for dialysis at present.  We will follow.

## 2016-09-13 NOTE — NC FL2 (Signed)
[place] LEVEL OF CARE SCREENING TOOL     IDENTIFICATION  Patient Name: Gloria Jarvis Birthdate: 09-17-1928 Sex: female Admission Date (Current Location): 09/11/2016  [place] and [place] Number:  Engineering geologist and Address:  [facility], [address] [place],  [address]      Provider Number: [id number]  Attending Physician Name and Address:  Bettey Costa, MD  Relative Name and Phone Number:       Current Level of Care: Hospital Recommended Level of Care: [place] Prior Approval Number:    Date Approved/Denied:   PASRR Number:   [id number]  Discharge Plan: SNF    Current Diagnoses: Patient Active Problem List   Diagnosis Date Noted  . ARF (acute renal failure) ([place]) 09/11/2016  . Hyperkalemia 06/14/2015  . Dysphagia 03/14/2015  . HTN (hypertension) 12/03/2014  . Chronic systolic heart failure ([place]) 10/27/2014  . Chronic kidney disease (CKD), stage IV (severe) ([place]) 10/12/2014  . Decreased body weight 03/05/2014  . Acid reflux 12/05/2013    Orientation RESPIRATION BLADDER Height & Weight     Self, Time, Situation, Place  Normal Continent Weight: 86 lb 1.6 oz (39.1 kg) Height:  '5\' 2"'$  (157.5 cm)  BEHAVIORAL SYMPTOMS/MOOD NEUROLOGICAL BOWEL NUTRITION STATUS      Continent Diet (Cardiac)  AMBULATORY STATUS COMMUNICATION OF NEEDS Skin   Extensive Assist Verbally Normal                       Personal Care Assistance Level of Assistance  Feeding, Dressing, Bathing Bathing Assistance: Limited assistance Feeding assistance: Independent Dressing Assistance: Limited assistance     Functional Limitations Info             SPECIAL CARE FACTORS FREQUENCY  PT (By licensed PT)     PT Frequency: Up to 5X per week, 5 days per week.              Contractures Contractures Info: Present    Additional Factors Info  Allergies   Allergies Info: Ace Inhibitors, Amlodipine, Entresto  Sacubitril-valsartan, Furosemide           Current Medications (09/13/2016):  This is the current hospital active medication list Current Facility-Administered Medications  Medication Dose Route Frequency Provider Last Rate Last Dose  . acetaminophen (TYLENOL) tablet 650 mg  650 mg Oral Q6H PRN Gladstone Lighter, MD   650 mg at 09/13/16 0000   Or  . acetaminophen (TYLENOL) suppository 650 mg  650 mg Rectal Q6H PRN Gladstone Lighter, MD      . carvedilol (COREG) tablet 12.5 mg  12.5 mg Oral BID WC Gladstone Lighter, MD   12.5 mg at 09/13/16 1703  . feeding supplement (BOOST / RESOURCE BREEZE) liquid 1 Container  1 Container Oral TID BM Bettey Costa, MD   1 Container at 09/13/16 1400  . heparin injection 5,000 Units  5,000 Units Subcutaneous Q8H Gladstone Lighter, MD   5,000 Units at 09/13/16 0534  . hydrALAZINE (APRESOLINE) injection 10 mg  10 mg Intravenous Q6H PRN Gladstone Lighter, MD      . levofloxacin (LEVAQUIN) tablet 500 mg  500 mg Oral Q48H Sital Mody, MD   500 mg at 09/13/16 1703  . magnesium oxide (MAG-OX) tablet 400 mg  400 mg Oral Daily Gladstone Lighter, MD   400 mg at 09/13/16 0829  . MEDLINE mouth rinse  15 mL Mouth Rinse BID Sital Mody, MD   15 mL at 09/13/16 1000  .  ondansetron (ZOFRAN) tablet 4 mg  4 mg Oral Q6H PRN Gladstone Lighter, MD       Or  . ondansetron (ZOFRAN) injection 4 mg  4 mg Intravenous Q6H PRN Gladstone Lighter, MD      . oseltamivir (TAMIFLU) capsule 30 mg  30 mg Oral Daily Bettey Costa, MD   30 mg at 09/13/16 1703  . pantoprazole (PROTONIX) EC tablet 40 mg  40 mg Oral BID AC Gladstone Lighter, MD   40 mg at 09/13/16 1703  . pravastatin (PRAVACHOL) tablet 20 mg  20 mg Oral Daily Gladstone Lighter, MD   20 mg at 09/13/16 0829  . sodium chloride flush (NS) 0.9 % injection 3 mL  3 mL Intravenous Q12H Gladstone Lighter, MD   3 mL at 09/13/16 1000     Discharge Medications: Please see discharge summary for a list of discharge medications.  Relevant Imaging  Results:  Relevant Lab Results:   Additional Information SS# [id number]  Zettie Pho, LCSW

## 2016-09-13 NOTE — Progress Notes (Signed)
[facility] [place] NAME: Gloria Jarvis    MR#:  [id number]  DATE OF BIRTH:  November 12, 1928  SUBJECTIVE:   patient doing well this am Not SOB Does not want to go to SNF at discharge  REVIEW OF SYSTEMS:    Review of Systems  Constitutional: Negative.  Negative for chills, fever and malaise/fatigue.  HENT: Negative.  Negative for ear discharge, ear pain, hearing loss, nosebleeds and sore throat.   Eyes: Negative.  Negative for blurred vision and pain.  Respiratory: Positive for shortness of breath (at baseline). Negative for cough, hemoptysis and wheezing.   Cardiovascular: Negative.  Negative for chest pain, palpitations and leg swelling.  Gastrointestinal: Negative.  Negative for abdominal pain, blood in stool, diarrhea, nausea and vomiting.  Genitourinary: Negative.  Negative for dysuria.  Musculoskeletal: Positive for falls. Negative for back pain.  Skin: Negative.   Neurological: Negative for dizziness, tremors, speech change, focal weakness, seizures and headaches.  Endo/Heme/Allergies: Negative.  Does not bruise/bleed easily.  Psychiatric/Behavioral: Negative for depression, hallucinations and suicidal ideas.    Tolerating Diet: yes      DRUG ALLERGIES:   Allergies  Allergen Reactions  . Ace Inhibitors Cough  . Amlodipine Diarrhea and Nausea And Vomiting  . Entresto [Sacubitril-Valsartan] Other (See Comments)    Back pain  . Furosemide Other (See Comments)    Blood sugar dropped    VITALS:  Blood pressure (!) 142/33, pulse 66, temperature 97.8 F (36.6 C), temperature source Oral, resp. rate 20, height '5\' 2"'$  (1.575 m), weight 39.1 kg (86 lb 1.6 oz), SpO2 99 %.  PHYSICAL EXAMINATION:   Physical Exam  Constitutional: She is oriented to person, place, and time. No distress.  Thin frail  HENT:  Head: Normocephalic.  Eyes: No scleral icterus.  Neck: Normal range of motion. Neck supple. No JVD present. No tracheal deviation  present.  Cardiovascular: Normal rate, regular rhythm and normal heart sounds.  Exam reveals no gallop and no friction rub.   No murmur heard. Pulmonary/Chest: Effort normal and breath sounds normal. No respiratory distress. She has no wheezes. She has no rales. She exhibits no tenderness.  Abdominal: Soft. Bowel sounds are normal. She exhibits no distension and no mass. There is no tenderness. There is no rebound and no guarding.  Musculoskeletal: Normal range of motion. She exhibits no edema.  Neurological: She is alert and oriented to person, place, and time.  Skin: Skin is warm. No rash noted. There is erythema.  bruising under neck from previous fall  Psychiatric: Affect and judgment normal.      LABORATORY PANEL:   CBC  Recent Labs Lab 09/12/16 0505  WBC 4.4  HGB 10.9*  HCT 33.0*  PLT 84*   ------------------------------------------------------------------------------------------------------------------  Chemistries   Recent Labs Lab 09/11/16 1650  09/13/16 0500  NA 140  < > 141  K 4.5  < > 4.8  CL 100*  < > 108  CO2 31  < > 28  GLUCOSE 106*  < > 103*  BUN 77*  < > 63*  CREATININE 3.26*  < > 2.92*  CALCIUM 8.8*  < > 8.1*  AST 25  --   --   ALT 8*  --   --   ALKPHOS 78  --   --   BILITOT 1.1  --   --   < > = values in this interval not displayed. ------------------------------------------------------------------------------------------------------------------  Cardiac Enzymes  Recent Labs Lab 09/11/16 1650  TROPONINI 0.07*   ------------------------------------------------------------------------------------------------------------------  RADIOLOGY:  Dg Chest 2 View  Result Date: 09/11/2016 CLINICAL DATA:  83 year old female with a history of shortness of breath. Prior esophageal carcinoma EXAM: CHEST  2 VIEW COMPARISON:  01/22/2014, 12/25/2013. Most recent abdominal CT 10/30/2015 FINDINGS: Cardiomediastinal silhouette unchanged with cardiomegaly.  Calcifications of the aortic arch and descending thoracic aorta. Surgical changes projecting over the lower mediastinum again noted. Unchanged cardiac pacing device on left chest wall with 2 leads in place. Pleural effusions at the bilateral lung bases obscuring the hemidiaphragm, blunting the costophrenic sulci. These appear unchanged dating to the comparison chest x-ray of 12/25/2013 Small nodules of the upper right lung, not appreciated on the comparison study. Persisting granuloma at the left lung base. Questionable interlobular septal thickening. No pneumothorax. Osteopenia. No displaced fracture. Multilevel degenerative changes of the spine. IMPRESSION: Small bilateral pleural effusions and associated atelectasis/ consolidation. Interlobular septal thickening and right upper lobe nodularity most likely relates to edema given the presence of the effusions, however, given the patient's known history of prior malignancy, follow-up chest x-ray may be considered in 3-4 weeks to assure resolution. Aortic atherosclerosis and cardiomegaly. Unchanged cardiac pacing device on the left chest wall. Surgical changes of the lower mediastinum. Signed, Dulcy Fanny. Earleen Newport, DO [facility] Electronically Signed   By: Corrie Mckusick D.O.   On: 09/11/2016 16:36   Ct Chest Wo Contrast  Result Date: 09/11/2016 CLINICAL DATA:  Chest pain and shortness of breath. History of lung cancer status post right lower lobectomy. EXAM: CT CHEST WITHOUT CONTRAST TECHNIQUE: Multidetector CT imaging of the chest was performed following the standard protocol without IV contrast. COMPARISON:  10/08/2014 PET-CT. 06/01/2014 CT of chest, abdomen, and pelvis. FINDINGS: Cardiovascular: Severe cardiomegaly. Moderate pericardial effusion. Aortic valvular calcification. Severe coronary artery calcification. Enlarged main pulmonary artery measuring 3.9 cm. Ascending aorta measures 3.8 cm. Aortic  atherosclerosis with extensive calcification. Mediastinum/Nodes: 9 mm nodule within the left lobe of the thyroid. Patulous esophagus containing fluid and debris. No lymphadenopathy. Lungs/Pleura: Clustered nodules and ground-glass opacities in a bronchovascular distribution with associated bronchial wall thickening and mucous plugging likely represents bronchopneumonia. Smooth interlobular septal thickening and small bilateral pleural effusions is compatible with interstitial pulmonary edema. Right lower lobectomy. Stable left lower lobe calcified granuloma. Upper Abdomen: Ascites in the perihepatic and perisplenic regions. Indeterminate 12 mm left adrenal nodule stable from 2015, probably an adenoma. Musculoskeletal: No chest wall mass or suspicious bone lesions identified. IMPRESSION: 1. Clustered nodules on glass opacities and bronchovascular distribution with bronchial thickening and mucous plugging probably representing which bronchopneumonia. Follow-up to resolution is recommended given history of pulmonary malignancy. 2. Interstitial pulmonary edema. 3. Small bilateral pleural effusions greater on the left. 4. Severe cardiomegaly. 5. Moderate pericardial effusion. 6. Aortic valvular calcification associated with aortic stenosis. 7. Severe coronary artery calcification. 8. Enlarged main pulmonary artery indicates underlying pulmonary artery hypertension. 9. Moderate emphysema. 10. Patulous and fluid-filled esophagus. 11. Perisplenic and perihepatic ascites. 12. 12 mm left adrenal nodule stable from 2015, probably adenoma. Electronically Signed   By: Kristine Garbe M.D.   On: 09/11/2016 18:28   US Renal  Result Date: 09/12/2016 CLINICAL DATA:  Acute renal failure. EXAM: RENAL / URINARY TRACT ULTRASOUND COMPLETE COMPARISON:  CT abdomen pelvis 10/30/2015 FINDINGS: Right Kidney: Length: 8.3 cm. Renal cortical thinning. Increased renal cortical echogenicity. No hydronephrosis. There is a 2.3 x 1.9 x  2.5 cm cyst within the interpolar region the right kidney and a 1.1 x  0.9 x 1.0 cm cyst within the inferior pole of the right kidney. Left Kidney: Length: 7.9 cm. Renal cortical thinning. Increased renal cortical echogenicity. Minimal splitting of the renal pelvis. No frank hydronephrosis. Bladder: Appears normal for degree of bladder distention. IMPRESSION: No frank hydronephrosis.  Minimal fullness of the left renal pelvis. Renal cortical thinning and increased renal cortical echogenicity as can be seen with chronic medical renal disease. Electronically Signed   By: Lovey Newcomer M.D.   On: 09/12/2016 11:07     ASSESSMENT AND PLAN:    83 year old female with a history of chronic atrial fibrillation not on anticoagulation due to fall risk, chronicCombined diastolic and systolic heart failure ejection fraction of now 35-40% and protein calorie malnutrition who presented with shortness of breath and weakness.  1. Acute hypoxic respiratory failure in the setting of bronchopneumonia and influenza A Continue Tamiflu D2/5 Continue Levaquin D2/5 Wean oxygen as tolerated  2. Chronic combined systolic and diastolic heart failure Ef 35-40% (improved) without signs of congestive heart failure Cardiology consultation Appreciated. Despite elevated BNP there is no clinical evidence of acute systolic heart failure.  Diuretics have been discontinued for now due to acute kidney injury    3. Acute kidney injury on chronic kidney disease stage III with baseline creatinine 2.5: Creatinine has improved with gentle hydration Stop IVF as per Dr Candiss Norse and monitor BMP for am. Renal ultrasound shows no evidence of hydronephrosis  4. Essential hypertension: Continue Coreg. Maxide discontinued for now due to acute kidney injury  5. Protein calorie malnutrition, moderate: Dietitian consult Continue Ensure  6. Hyperlipidemia: Continue statin  Physical therapy is recommending skilled nursing facility at discharge  however patient would like to go home with home health.  Management plans discussed with the patient and she is in agreement.  CODE STATUS: FULL  TOTAL TIME TAKING CARE OF THIS PATIENT: 59mnutes.     POSSIBLE D/C tomorrow, DEPENDING ON CLINICAL CONDITION/creatinine.   Hevin Jeffcoat M.D on 09/13/2016 at 9:46 AM  Between 7am to 6pm - Pager - [phone] After 6pm go to [email or web address] - password [email or web address]  Office  [phone] CC: Primary care physician; MRusty Aus MD  Note: This dictation was prepared with Dragon dictation along with smaller phrase technology. Any transcriptional errors that result from this process are unintentional.

## 2016-09-13 NOTE — Progress Notes (Signed)
2 L of oxygen. iso for flu. Nsr. Takes meds ok. A &O. Up to Br and tolerated it well. Up to chair for most of the day and tolerated it well. Family visited pt. Good po intake. ivf were d/c. Pt has no further concerns at this time.

## 2016-09-14 LAB — BASIC METABOLIC PANEL
ANION GAP: 4 — AB (ref 5–15)
BUN: 65 mg/dL — ABNORMAL HIGH (ref 6–20)
CO2: 29 mmol/L (ref 22–32)
Calcium: 8.2 mg/dL — ABNORMAL LOW (ref 8.9–10.3)
Chloride: 106 mmol/L (ref 101–111)
Creatinine, Ser: 2.87 mg/dL — ABNORMAL HIGH (ref 0.44–1.00)
GFR calc Af Amer: 16 mL/min — ABNORMAL LOW (ref >60)
GFR, EST NON AFRICAN AMERICAN: 14 mL/min — AB (ref >60)
Glucose, Bld: 97 mg/dL (ref 65–99)
POTASSIUM: 5.5 mmol/L — AB (ref 3.5–5.1)
SODIUM: 139 mmol/L (ref 135–145)

## 2016-09-14 LAB — POTASSIUM: Potassium: 4.4 mmol/L (ref 3.5–5.1)

## 2016-09-14 MED ORDER — OSELTAMIVIR PHOSPHATE 30 MG PO CAPS: 30.0000 mg | ORAL_CAPSULE | Freq: Every day | ORAL | 0 refills | Status: AC

## 2016-09-14 MED ORDER — BOOST / RESOURCE BREEZE PO LIQD: 1.0000 | Freq: Three times a day (TID) | ORAL | 0 refills | Status: DC

## 2016-09-14 MED ORDER — LEVOFLOXACIN 500 MG PO TABS: 500.0000 mg | ORAL_TABLET | ORAL | 0 refills | Status: AC

## 2016-09-14 MED ORDER — DEXTROSE 50 % IV SOLN
1.0000 | Freq: Once | INTRAVENOUS | Status: AC
  Administered 2016-09-14: 09:00:00 50 mL via INTRAVENOUS
  Filled 2016-09-14: qty 50

## 2016-09-14 MED ORDER — INSULIN ASPART 100 UNIT/ML ~~LOC~~ SOLN
10.0000 [IU] | Freq: Once | SUBCUTANEOUS | Status: AC
  Administered 2016-09-14: 10 [IU] via INTRAVENOUS
  Filled 2016-09-14 (×2): qty 0.1

## 2016-09-14 MED ORDER — SODIUM POLYSTYRENE SULFONATE 15 GM/60ML PO SUSP
30.0000 g | Freq: Once | ORAL | Status: AC
  Administered 2016-09-14: 08:00:00 30 g via ORAL
  Filled 2016-09-14: qty 120

## 2016-09-14 NOTE — Discharge Summary (Addendum)
[facility] [place] NAME: Gloria Jarvis    MR#:  [id number]  DATE OF BIRTH:  09-15-1928  DATE OF ADMISSION:  09/11/2016 ADMITTING PHYSICIAN: Gladstone Lighter, MD  DATE OF DISCHARGE: 09/14/2016  PRIMARY CARE PHYSICIAN: Rusty Aus, MD    ADMISSION DIAGNOSIS:  Shortness of breath [R06.02] Bronchopneumonia [J18.0] ARF (acute renal failure) (HCC) [N17.9] AKI (acute kidney injury) ([place]) [N17.9] Chest pain [R07.9] Chest pain, unspecified type [R07.9]  DISCHARGE DIAGNOSIS:  Active Problems:   ARF (acute renal failure) ([place])   SECONDARY DIAGNOSIS:   Past Medical History:  Diagnosis Date  . Anemia   . Anxiety   . Arthritis   . Congestive heart failure (CHF) ([place])   . Depressed   . Esophageal cancer ([place])   . GERD (gastroesophageal reflux disease)   . Hyperlipidemia   . Hypertension   . Lung cancer ([place])   . Renal insufficiency   . Syncope and collapse     HOSPITAL COURSE:  83 year old female with a history of chronic atrial fibrillation not on anticoagulation due to fall risk, chronicCombined diastolic and systolic heart failure ejection fraction of now 35-40% and protein calorie malnutrition who presented with shortness of breath and weakness.  1. Acute hypoxic respiratory failure in the setting of bronchopneumonia and influenza A Continue Tamiflu D3/5 Continue Levaquin D3/5 Wean oxygen as tolerated  2. Chronic combined systolic and diastolic heart failure Ef 35-40% (improved) without signs of congestive heart failure Cardiology consultation Appreciated. Despite elevated BNP there is no clinical evidence of acute systolic heart failure.  Diuretics have been discontinued for now due to acute kidney injury, but may be restarted in the next 3 days. She is referred to CHF clinic at discharge    3. Acute kidney injury on chronic kidney disease stage III with baseline creatinine 2.5: Creatinine has improved with gentle  hydration Renal ultrasound shows no evidence of hydronephrosis She will have outpatient nephrology follow up.  4. Essential hypertension: Continue Coreg. Maxide discontinued for now due to acute kidney injury  5. Protein calorie malnutrition, moderate: Dietitian consult Continue Ensure  6. Hyperlipidemia: Continue statin   DISCHARGE CONDITIONS AND DIET:   Stable Cardiac diet  CONSULTS OBTAINED:  Treatment Team:  Murlean Iba, MD Yolonda Kida, MD  DRUG ALLERGIES:   Allergies  Allergen Reactions  . Ace Inhibitors Cough  . Amlodipine Diarrhea and Nausea And Vomiting  . Entresto [Sacubitril-Valsartan] Other (See Comments)    Back pain  . Furosemide Other (See Comments)    Blood sugar dropped    DISCHARGE MEDICATIONS:   Current Discharge Medication List    START taking these medications   Details  feeding supplement (BOOST / RESOURCE BREEZE) LIQD Take 1 Container by mouth 3 (three) times daily between meals. Qty: 84 Container, Refills: 0    levofloxacin (LEVAQUIN) 500 MG tablet Take 1 tablet (500 mg total) by mouth every other day. Qty: 1 tablet, Refills: 0    oseltamivir (TAMIFLU) 30 MG capsule Take 1 capsule (30 mg total) by mouth daily. Qty: 2 capsule, Refills: 0      CONTINUE these medications which have NOT CHANGED   Details  acetaminophen (TYLENOL) 500 MG tablet Take 500 mg by mouth every 6 (six) hours as needed for headache.    carvedilol (COREG) 12.5 MG tablet take 1 tablet by mouth twice a day Qty: 60 tablet, Refills: 5    magnesium oxide (MAG-OX) 400 (241.3 Mg) MG tablet Take 400 mg  by mouth daily. Refills: 5    omeprazole (PRILOSEC) 20 MG capsule Take 20 mg by mouth 2 (two) times daily before a meal.    pravastatin (PRAVACHOL) 20 MG tablet Take 20 mg by mouth daily.      STOP taking these medications     triamterene-hydrochlorothiazide (MAXZIDE-25) 37.5-25 MG tablet           Today   CHIEF COMPLAINT:  Nausea this am from  kayelalate from hyperkalemia   VITAL SIGNS:  Blood pressure (!) 160/34, pulse 73, temperature 97.9 F (36.6 C), temperature source Oral, resp. rate 20, height '5\' 2"'$  (1.575 m), weight 40.8 kg (89 lb 14.4 oz), SpO2 100 %.   REVIEW OF SYSTEMS:  Review of Systems  Constitutional: Positive for malaise/fatigue. Negative for chills and fever.  HENT: Negative.  Negative for ear discharge, ear pain, hearing loss, nosebleeds and sore throat.   Eyes: Negative.  Negative for blurred vision and pain.  Respiratory: Negative.  Negative for cough, hemoptysis, shortness of breath and wheezing.   Cardiovascular: Negative.  Negative for chest pain, palpitations and leg swelling.  Gastrointestinal: Positive for nausea (from kayexalate). Negative for abdominal pain, blood in stool, diarrhea and vomiting.  Genitourinary: Negative.  Negative for dysuria.  Musculoskeletal: Negative.  Negative for back pain.  Skin: Negative.   Neurological: Positive for weakness. Negative for dizziness, tremors, speech change, focal weakness, seizures and headaches.  Endo/Heme/Allergies: Negative.  Does not bruise/bleed easily.  Psychiatric/Behavioral: Negative.  Negative for depression, hallucinations and suicidal ideas.     PHYSICAL EXAMINATION:  GENERAL:  83 y.o.-year-old patient lying in the bed with no acute distress. Frail thin NECK:  Supple, no jugular venous distention. No thyroid enlargement, no tenderness.  LUNGS: Normal breath sounds bilaterally, no wheezing, rales,rhonchi  No use of accessory muscles of respiration.  CARDIOVASCULAR: S1, S2 normal. No murmurs, rubs, or gallops.  ABDOMEN: Soft, non-tender, non-distended. Bowel sounds present. No organomegaly or mass.  EXTREMITIES: No pedal edema, cyanosis, or clubbing.  PSYCHIATRIC: The patient is alert and oriented x 3.  SKIN: No obvious rash, lesion, or ulcer.   DATA REVIEW:   CBC  Recent Labs Lab 09/12/16 0505  WBC 4.4  HGB 10.9*  HCT 33.0*  PLT 84*     Chemistries   Recent Labs Lab 09/11/16 1650  09/14/16 0424  NA 140  < > 139  K 4.5  < > 5.5*  CL 100*  < > 106  CO2 31  < > 29  GLUCOSE 106*  < > 97  BUN 77*  < > 65*  CREATININE 3.26*  < > 2.87*  CALCIUM 8.8*  < > 8.2*  AST 25  --   --   ALT 8*  --   --   ALKPHOS 78  --   --   BILITOT 1.1  --   --   < > = values in this interval not displayed.  Cardiac Enzymes  Recent Labs Lab 09/11/16 1650  TROPONINI 0.07*    Microbiology Results  '@[id number]'$ @  RADIOLOGY:  US Renal  Result Date: 09/12/2016 CLINICAL DATA:  Acute renal failure. EXAM: RENAL / URINARY TRACT ULTRASOUND COMPLETE COMPARISON:  CT abdomen pelvis 10/30/2015 FINDINGS: Right Kidney: Length: 8.3 cm. Renal cortical thinning. Increased renal cortical echogenicity. No hydronephrosis. There is a 2.3 x 1.9 x 2.5 cm cyst within the interpolar region the right kidney and a 1.1 x 0.9 x 1.0 cm cyst within the inferior pole of the right kidney. Left Kidney:  Length: 7.9 cm. Renal cortical thinning. Increased renal cortical echogenicity. Minimal splitting of the renal pelvis. No frank hydronephrosis. Bladder: Appears normal for degree of bladder distention. IMPRESSION: No frank hydronephrosis.  Minimal fullness of the left renal pelvis. Renal cortical thinning and increased renal cortical echogenicity as can be seen with chronic medical renal disease. Electronically Signed   By: Lovey Newcomer M.D.   On: 09/12/2016 11:07      Current Discharge Medication List    START taking these medications   Details  feeding supplement (BOOST / RESOURCE BREEZE) LIQD Take 1 Container by mouth 3 (three) times daily between meals. Qty: 84 Container, Refills: 0    levofloxacin (LEVAQUIN) 500 MG tablet Take 1 tablet (500 mg total) by mouth every other day. Qty: 1 tablet, Refills: 0    oseltamivir (TAMIFLU) 30 MG capsule Take 1 capsule (30 mg total) by mouth daily. Qty: 2 capsule, Refills: 0      CONTINUE these medications which have  NOT CHANGED   Details  acetaminophen (TYLENOL) 500 MG tablet Take 500 mg by mouth every 6 (six) hours as needed for headache.    carvedilol (COREG) 12.5 MG tablet take 1 tablet by mouth twice a day Qty: 60 tablet, Refills: 5    magnesium oxide (MAG-OX) 400 (241.3 Mg) MG tablet Take 400 mg by mouth daily. Refills: 5    omeprazole (PRILOSEC) 20 MG capsule Take 20 mg by mouth 2 (two) times daily before a meal.    pravastatin (PRAVACHOL) 20 MG tablet Take 20 mg by mouth daily.      STOP taking these medications     triamterene-hydrochlorothiazide (MAXZIDE-25) 37.5-25 MG tablet          Management plans discussed with the patient and she is in agreement. Stable for discharge   Patient should follow up with pcp  CODE STATUS:     Code Status Orders        Start     Ordered   09/11/16 2228  Full code  Continuous     09/11/16 2227    Code Status History    Date Active Date Inactive Code Status Order ID Comments User Context   This patient has a current code status but no historical code status.    Advance Directive Documentation   Flowsheet Row Most Recent Value  Type of Advance Directive  Healthcare Power of Attorney, Living will  Pre-existing out of facility DNR order (yellow form or pink MOST form)  No data  "MOST" Form in Place?  No data      TOTAL TIME TAKING CARE OF THIS PATIENT: 37 minutes.    Note: This dictation was prepared with Dragon dictation along with smaller phrase technology. Any transcriptional errors that result from this process are unintentional.  Adisa Vigeant M.D on 09/14/2016 at 8:39 AM  Between 7am to 6pm - Pager - [phone] After 6pm go to [email or web address] - password [email or web address] [facility]  Office  [phone]  CC: Primary care physician; Rusty Aus, MD

## 2016-09-14 NOTE — Clinical Social Work Placement (Signed)
   CLINICAL SOCIAL WORK PLACEMENT  NOTE  Date:  09/14/2016  Patient Details  Name: Gloria Jarvis MRN: [id number] Date of Birth: 06-Feb-1929  Clinical Social Work is seeking post-discharge placement for this patient at the [place] level of care (*CSW will initial, date and re-position this form in  chart as items are completed):  Yes   Patient/family provided with [place] Work Department's list of facilities offering this level of care within the geographic area requested by the patient (or if unable, by the patient's family).  Yes   Patient/family informed of their freedom to choose among providers that offer the needed level of care, that participate in Medicare, Medicaid or managed care program needed by the patient, have an available bed and are willing to accept the patient.  Yes   Patient/family informed of [facility]'s ownership interest in [facility] and [facility], as well as of the fact that they are under no obligation to receive care at these facilities.  PASRR submitted to EDS on 09/13/16     PASRR number received on 09/13/16     Existing PASRR number confirmed on       FL2 transmitted to all facilities in geographic area requested by pt/family on 09/13/16     FL2 transmitted to all facilities within larger geographic area on       Patient informed that his/her managed care company has contracts with or will negotiate with certain facilities, including the following:        Yes   Patient/family informed of bed offers received.  Patient chooses bed at [facility]     Physician recommends and patient chooses bed at      Patient to be transferred to [facility] on 09/14/16.  Patient to be transferred to facility by [facility]     Patient family notified on 09/14/16 of transfer.  Name of family member notified:  Pt updated son Bill     PHYSICIAN       Additional Comment:     _______________________________________________ Darden Dates, LCSW 09/14/2016, 4:56 PM

## 2016-09-14 NOTE — Progress Notes (Signed)
Report called to Lattie Haw, Therapist, sports at  [facility] and EMS contacted for transport.  Clarise Cruz, RN

## 2016-09-14 NOTE — Progress Notes (Signed)
Follow-up appointment at the [facility] was made on September 22, 2016 at 10:30am. She has not been seen since December 2016. Thank you for the referral.

## 2016-09-14 NOTE — Progress Notes (Signed)
Escorted out via EMS to [facility]-no distress noted.

## 2016-09-14 NOTE — Discharge Instructions (Signed)
Heart Failure Clinic appointment on September 22, 2016 at 10:30am with Darylene Price, [place]. Please call [phone] to reschedule.

## 2016-09-14 NOTE — Care Management (Signed)
Received referral for home health and home needs. Physical therapy evaluation completed. Recommending skilled nursing facility. Will sign off Shelbie Ammons RN MSN CCM Care Management

## 2016-09-14 NOTE — Clinical Social Work Note (Deleted)
Pt is ready for discharge today and will go to [facility]. Facility has obtained auth and is ready to admit pt, as they have also received discharge information. Pt called her son to update him. Pt is aware and agreeable to discharge plan. RN will call report and [facility] will provide transportation. CSW is signing off as no further needs identified.   Darden Dates, MSW, LCSW  Clinical Social Worker  [phone]

## 2016-09-14 NOTE — Clinical Social Work Note (Signed)
Clinical Social Work Assessment  Patient Details  Name: Gloria Jarvis MRN: [id number] Date of Birth: [birth year]  Date of referral:  09/14/16               Reason for consult:  Facility Placement, Discharge Planning                Permission sought to share information with:  Family Supports Permission granted to share information::  Yes, Verbal Permission Granted  Name::     Joi Leyva  Relationship::  son   Contact Information:  [phone]  Housing/Transportation Living arrangements for the past 2 months:  [place] of Information:  Patient Patient Interpreter Needed:  None Criminal Activity/Legal Involvement Pertinent to Current Situation/Hospitalization:  No - Comment as needed Significant Relationships:  Adult Children Lives with:  Self Do you feel safe going back to the place where you live?  Yes Need for family participation in patient care:  No (Coment)  Care giving concerns:  Pt is in need of STR.   Social Worker assessment / plan:  CSW met with pt to address consult for New SNF. CSW introduced herself and explained role of social work, as well as process of discharging to SNF. SNF search was initiated and pt chose [facility]. CSW updated facility, and auth was initiated.   Pt is ready for discharge today. Facility has robtained [place] and is ready to admit pt, as they have also received discharge information. Pt called her son to update him. Pt is aware and agreeable to discharge plan. RN will call report and [facility] will provide transportation. CSW is signing off as no further needs identified.   Employment status:  Retired Nurse, adult PT Recommendations:  [place] / Referral to community resources:  [place]  Patient/Family's Response to care:  Pt was appreciative of CSW support.   Patient/Family's Understanding of and Emotional Response to Diagnosis, Current  Treatment, and Prognosis:  Pt understands that she would benefit from STR prior to returning home.   Emotional Assessment Appearance:  Appears stated age Attitude/Demeanor/Rapport:   (Appropriate) Affect (typically observed):  Accepting, Adaptable, Pleasant Orientation:  Oriented to Self, Oriented to Place, Oriented to  Time, Oriented to Situation Alcohol / Substance use:  Not Applicable Psych involvement (Current and /or in the community):  No (Comment)  Discharge Needs  Concerns to be addressed:  Adjustment to Illness Readmission within the last 30 days:  No Current discharge risk:  None Barriers to Discharge:  No Barriers Identified   Darden Dates, LCSW 09/14/2016, 4:49 PM

## 2016-09-14 NOTE — Care Management Important Message (Signed)
Important Message  Patient Details  Name: Gloria Jarvis MRN: [id number] Date of Birth: February 06, 1929   Medicare Important Message Given:  Yes    Shelbie Ammons, RN 09/14/2016, 8:53 AM

## 2016-09-14 NOTE — Progress Notes (Addendum)
[place] [facility] [place] NAME: Gloria Jarvis    MR#:  [id number]  DATE OF BIRTH:  1929-01-17  SUBJECTIVE:   nausae this am after kayexalte give as K was elevated on labs    REVIEW OF SYSTEMS:    Review of Systems  Constitutional: Negative.  Negative for chills, fever and malaise/fatigue.  HENT: Negative.  Negative for ear discharge, ear pain, hearing loss, nosebleeds and sore throat.   Eyes: Negative.  Negative for blurred vision and pain.  Respiratory: Negative for cough, hemoptysis and wheezing. Shortness of breath: at baseline.   Cardiovascular: Negative.  Negative for chest pain, palpitations and leg swelling.  Gastrointestinal: Positive for nausea. Negative for abdominal pain, blood in stool, diarrhea and vomiting.  Genitourinary: Negative.  Negative for dysuria.  Musculoskeletal: Positive for falls. Negative for back pain.  Skin: Negative.   Neurological: Negative for dizziness, tremors, speech change, focal weakness, seizures and headaches.  Endo/Heme/Allergies: Negative.  Does not bruise/bleed easily.  Psychiatric/Behavioral: Negative for depression, hallucinations and suicidal ideas.    Tolerating Diet: yes      DRUG ALLERGIES:   Allergies  Allergen Reactions  . Ace Inhibitors Cough  . Amlodipine Diarrhea and Nausea And Vomiting  . Entresto [Sacubitril-Valsartan] Other (See Comments)    Back pain  . Furosemide Other (See Comments)    Blood sugar dropped    VITALS:  Blood pressure (!) 160/34, pulse 73, temperature 97.9 F (36.6 C), temperature source Oral, resp. rate 20, height '5\' 2"'$  (1.575 m), weight 40.8 kg (89 lb 14.4 oz), SpO2 100 %.  PHYSICAL EXAMINATION:   Physical Exam  Constitutional: She is oriented to person, place, and time. No distress.  Thin frail  HENT:  Head: Normocephalic.  Eyes: No scleral icterus.  Neck: Normal range of motion. Neck supple. No JVD present. No tracheal deviation present.   Cardiovascular: Normal rate, regular rhythm and normal heart sounds.  Exam reveals no gallop and no friction rub.   No murmur heard. Pulmonary/Chest: Effort normal and breath sounds normal. No respiratory distress. She has no wheezes. She has no rales. She exhibits no tenderness.  Abdominal: Soft. Bowel sounds are normal. She exhibits no distension and no mass. There is no tenderness. There is no rebound and no guarding.  Musculoskeletal: Normal range of motion. She exhibits no edema.  Neurological: She is alert and oriented to person, place, and time.  Skin: Skin is warm. No rash noted. No erythema.  bruising under neck from previous fall  Psychiatric: Affect and judgment normal.      LABORATORY PANEL:   CBC  Recent Labs Lab 09/12/16 0505  WBC 4.4  HGB 10.9*  HCT 33.0*  PLT 84*   ------------------------------------------------------------------------------------------------------------------  Chemistries   Recent Labs Lab 09/11/16 1650  09/14/16 0424  NA 140  < > 139  K 4.5  < > 5.5*  CL 100*  < > 106  CO2 31  < > 29  GLUCOSE 106*  < > 97  BUN 77*  < > 65*  CREATININE 3.26*  < > 2.87*  CALCIUM 8.8*  < > 8.2*  AST 25  --   --   ALT 8*  --   --   ALKPHOS 78  --   --   BILITOT 1.1  --   --   < > = values in this interval not displayed. ------------------------------------------------------------------------------------------------------------------  Cardiac Enzymes  Recent Labs Lab 09/11/16 1650  TROPONINI 0.07*   ------------------------------------------------------------------------------------------------------------------  RADIOLOGY:  US Renal  Result Date: 09/12/2016 CLINICAL DATA:  Acute renal failure. EXAM: RENAL / URINARY TRACT ULTRASOUND COMPLETE COMPARISON:  CT abdomen pelvis 10/30/2015 FINDINGS: Right Kidney: Length: 8.3 cm. Renal cortical thinning. Increased renal cortical echogenicity. No hydronephrosis. There is a 2.3 x 1.9 x 2.5 cm cyst  within the interpolar region the right kidney and a 1.1 x 0.9 x 1.0 cm cyst within the inferior pole of the right kidney. Left Kidney: Length: 7.9 cm. Renal cortical thinning. Increased renal cortical echogenicity. Minimal splitting of the renal pelvis. No frank hydronephrosis. Bladder: Appears normal for degree of bladder distention. IMPRESSION: No frank hydronephrosis.  Minimal fullness of the left renal pelvis. Renal cortical thinning and increased renal cortical echogenicity as can be seen with chronic medical renal disease. Electronically Signed   By: Lovey Newcomer M.D.   On: 09/12/2016 11:07     ASSESSMENT AND PLAN:    83 year old female with a history of chronic atrial fibrillation not on anticoagulation due to fall risk, chronicCombined diastolic and systolic heart failure ejection fraction of now 35-40% and protein calorie malnutrition who presented with shortness of breath and weakness.  1. Acute hypoxic respiratory failure in the setting of bronchopneumonia and influenza A Continue Tamiflu D3/5 Continue Levaquin D3/5 Wean oxygen as tolerated  2. Chronic combined systolic and diastolic heart failure Ef 35-40% (improved) without signs of congestive heart failure Cardiology consultation Appreciated. Despite elevated BNP there is no clinical evidence of acute systolic heart failure.  Diuretics have been discontinued for now due to acute kidney injury    3. Acute kidney injury on chronic kidney disease stage III with baseline creatinine 2.5: Creatinine has improved with gentle hydration. IVF now stopped and improved creatinine. Renal ultrasound shows no evidence of hydronephrosis  4. Essential hypertension: Continue Coreg. Maxide discontinued for now due to acute kidney injury  5. Protein calorie malnutrition, moderate: Dietitian consult Continue Ensure  6. Hyperlipidemia: Continue statin  7. Hyperkalemia: treated and repeat at noon  Physical therapy is recommending skilled  nursing facility at discharge however patient would like to go home with home health.  Management plans discussed with the patient and she is in agreement.  CODE STATUS: FULL  TOTAL TIME TAKING CARE OF THIS PATIENT: 34mnutes.     POSSIBLE D/C today, DEPENDING ON CLINICAL CONDITION/potassium  Worley Radermacher M.D on 09/14/2016 at 8:42 AM  Between 7am to 6pm - Pager - [phone] After 6pm go to [email or web address] - password [email or web address]  Office  [phone] CC: Primary care physician; MRusty Aus MD  Note: This dictation was prepared with Dragon dictation along with smaller phrase technology. Any transcriptional errors that result from this process are unintentional.

## 2016-09-14 NOTE — Progress Notes (Signed)
[facility]  ROUNDING NOTE   Subjective:  Patient resting comfortably in bed. Creatinine slightly down to 2.8. Potassium down to 4.4 on repeat.   Objective:  Vital signs in last 24 hours:  Temp:  [97.9 F (36.6 C)-98.3 F (36.8 C)] 97.9 F (36.6 C) (02/18 2000) Pulse Rate:  [70-73] 73 (02/19 0814) Resp:  [20] 20 (02/18 2000) BP: (138-160)/(34-43) 160/34 (02/19 0814) SpO2:  [100 %] 100 % (02/18 2000) Weight:  [40.8 kg (89 lb 14.4 oz)] 40.8 kg (89 lb 14.4 oz) (02/19 0500)  Weight change: 1.724 kg (3 lb 12.8 oz) Filed Weights   09/12/16 0500 09/13/16 0500 09/14/16 0500  Weight: 38.4 kg (84 lb 11.2 oz) 39.1 kg (86 lb 1.6 oz) 40.8 kg (89 lb 14.4 oz)    Intake/Output: I/O last 3 completed shifts: In: 921.7 [P.O.:240; I.V.:681.7] Out: 500 [Urine:500]   Intake/Output this shift:  No intake/output data recorded.  Physical Exam: General: No acute distress  Head: Left forehead hematoma  Eyes: Anicteric  Neck: Supple, trachea midline  Lungs:  Clear to auscultation, normal effort  Heart: S1S2 irregular  Abdomen:  Soft, nontender, bowel sounds present  Extremities: trace peripheral edema.  Neurologic: Awake, alert, following commands  Skin: Multiple ecchymoses       Basic Metabolic Panel:  Recent Labs Lab 09/11/16 1650 09/12/16 0505 09/13/16 0500 09/14/16 0424 09/14/16 1040  NA 140 142 141 139  --   K 4.5 4.8 4.8 DUPLICATE REQUEST  5.5* 4.4  CL 100* 106 108 106  --   CO2 '31 29 28 29  '$ --   GLUCOSE 106* 99 103* 97  --   BUN 77* 72* 63* 65*  --   CREATININE 3.26* 3.14* 2.92* 2.87*  --   CALCIUM 8.8* 8.4* 8.1* 8.2*  --     Liver Function Tests:  Recent Labs Lab 09/11/16 1650  AST 25  ALT 8*  ALKPHOS 78  BILITOT 1.1  PROT 6.9  ALBUMIN 3.4*   No results for input(s): LIPASE, AMYLASE in the last 168 hours. No results for input(s): AMMONIA in the last 168 hours.  CBC:  Recent Labs Lab 09/11/16 1650 09/12/16 0505  WBC 4.2 4.4   NEUTROABS 2.9  --   HGB 12.0 10.9*  HCT 35.5 33.0*  MCV 101.2* 103.2*  PLT 99* 84*    Cardiac Enzymes:  Recent Labs Lab 09/11/16 1650  TROPONINI 0.07*    BNP: Invalid input(s): POCBNP  CBG: No results for input(s): GLUCAP in the last 168 hours.  Microbiology: Results for orders placed or performed during the hospital encounter of 09/11/16  Blood culture (routine x 2)     Status: None (Preliminary result)   Collection Time: 09/11/16  8:20 PM  Result Value Ref Range Status   Specimen Description BLOOD RIGHT ANTECUBITAL  Final   Special Requests   Final    BOTTLES DRAWN AEROBIC AND ANAEROBIC 12CCAERO,11CCANA,BCHV BLOOD CULTURE VOLUME HIGH   Culture NO GROWTH 3 DAYS  Final   Report Status PENDING  Incomplete  Blood culture (routine x 2)     Status: None (Preliminary result)   Collection Time: 09/11/16  8:20 PM  Result Value Ref Range Status   Specimen Description BLOOD LEFT ANTECUBITAL  Final   Special Requests   Final    BOTTLES DRAWN AEROBIC AND ANAEROBIC 13CCAERO,11CCANA,BCHV BLOOD CULTURE VOLUME HIGH   Culture NO GROWTH 3 DAYS  Final   Report Status PENDING  Incomplete    Coagulation Studies: No results  for input(s): LABPROT, INR in the last 72 hours.  Urinalysis: No results for input(s): COLORURINE, LABSPEC, PHURINE, GLUCOSEU, HGBUR, BILIRUBINUR, KETONESUR, PROTEINUR, UROBILINOGEN, NITRITE, LEUKOCYTESUR in the last 72 hours.  Invalid input(s): APPERANCEUR    Imaging: No results found.   Medications:    . carvedilol  12.5 mg Oral BID WC  . feeding supplement  1 Container Oral TID BM  . heparin subcutaneous  5,000 Units Subcutaneous Q8H  . levofloxacin  500 mg Oral Q48H  . magnesium oxide  400 mg Oral Daily  . mouth rinse  15 mL Mouth Rinse BID  . oseltamivir  30 mg Oral Daily  . pantoprazole  40 mg Oral BID AC  . pravastatin  20 mg Oral Daily  . sodium chloride flush  3 mL Intravenous Q12H   acetaminophen **OR** acetaminophen, hydrALAZINE,  ondansetron **OR** ondansetron (ZOFRAN) IV  Assessment/ Plan:  83 y.o. female with a PMHX of atrial fibrillation,GERD, hypertension, chronic systolic congestive heart failure EF 35-40%, chronic kidney disease, history of esophageal cancer status post surgery, was admitted on 09/11/2016 status post fall  1.  Acute renal failure on chronic kidney disease stage III 2.  SOB - Pneumonia, Influenza A and Acute pulmonary edema  3. B/l pleural effusions 4.  Severe cardiomegaly 5.  Moderate pericardial effusion 6.  Aortic calcification of aortic stenosis and severe coronary calcification 7.  Emphysema 8. Pulmonary hypertension- presumed due to enlarged pulmonary artery seen on CT 9.  Anemia of CKD, hgb 10.9.  Acute renal failure is likely a combination of ATN from pneumonia, influenza Patient has several cardiopulmonary physiologic abnormalities including cardiomegaly, pericardial effusion, aortic stenosis, emphysema and probably pulmonary hypertension  Plan:  Renal function has improved but not yet back down to her baseline. It appears that discharge is being planned. We recommend that her renal function continue to be followed as an outpatient. She is being treated for the influenza virus as well as pneumonia. We will set up follow-up with Dr. Candiss Norse in the office in one to 2 weeks.   LOS: 3 Gloria Jarvis 2/19/201811:22 AM

## 2016-09-16 LAB — CULTURE, BLOOD (ROUTINE X 2)
CULTURE: NO GROWTH
Culture: NO GROWTH

## 2016-09-22 ENCOUNTER — Encounter: Payer: Self-pay | Admitting: Family

## 2016-09-22 ENCOUNTER — Ambulatory Visit: Payer: Medicare HMO | Attending: Family | Admitting: Family

## 2016-09-22 VITALS — BP 138/60 | HR 80 | Resp 18 | Ht 63.0 in | Wt 100.1 lb

## 2016-09-22 DIAGNOSIS — I482 Chronic atrial fibrillation, unspecified: Secondary | ICD-10-CM

## 2016-09-22 DIAGNOSIS — K219 Gastro-esophageal reflux disease without esophagitis: Secondary | ICD-10-CM | POA: Insufficient documentation

## 2016-09-22 DIAGNOSIS — Z90711 Acquired absence of uterus with remaining cervical stump: Secondary | ICD-10-CM | POA: Diagnosis not present

## 2016-09-22 DIAGNOSIS — D649 Anemia, unspecified: Secondary | ICD-10-CM | POA: Diagnosis not present

## 2016-09-22 DIAGNOSIS — Z888 Allergy status to other drugs, medicaments and biological substances status: Secondary | ICD-10-CM | POA: Diagnosis not present

## 2016-09-22 DIAGNOSIS — I10 Essential (primary) hypertension: Secondary | ICD-10-CM

## 2016-09-22 DIAGNOSIS — E785 Hyperlipidemia, unspecified: Secondary | ICD-10-CM | POA: Diagnosis not present

## 2016-09-22 DIAGNOSIS — Z836 Family history of other diseases of the respiratory system: Secondary | ICD-10-CM | POA: Insufficient documentation

## 2016-09-22 DIAGNOSIS — Z950 Presence of cardiac pacemaker: Secondary | ICD-10-CM | POA: Diagnosis not present

## 2016-09-22 DIAGNOSIS — I5022 Chronic systolic (congestive) heart failure: Secondary | ICD-10-CM | POA: Insufficient documentation

## 2016-09-22 DIAGNOSIS — I110 Hypertensive heart disease with heart failure: Secondary | ICD-10-CM | POA: Diagnosis not present

## 2016-09-22 DIAGNOSIS — M1990 Unspecified osteoarthritis, unspecified site: Secondary | ICD-10-CM | POA: Insufficient documentation

## 2016-09-22 DIAGNOSIS — Z87891 Personal history of nicotine dependence: Secondary | ICD-10-CM | POA: Diagnosis not present

## 2016-09-22 DIAGNOSIS — Z85118 Personal history of other malignant neoplasm of bronchus and lung: Secondary | ICD-10-CM | POA: Insufficient documentation

## 2016-09-22 DIAGNOSIS — N179 Acute kidney failure, unspecified: Secondary | ICD-10-CM | POA: Insufficient documentation

## 2016-09-22 DIAGNOSIS — Z8501 Personal history of malignant neoplasm of esophagus: Secondary | ICD-10-CM | POA: Insufficient documentation

## 2016-09-22 DIAGNOSIS — I351 Nonrheumatic aortic (valve) insufficiency: Secondary | ICD-10-CM | POA: Insufficient documentation

## 2016-09-22 DIAGNOSIS — Z823 Family history of stroke: Secondary | ICD-10-CM | POA: Insufficient documentation

## 2016-09-22 DIAGNOSIS — Z8249 Family history of ischemic heart disease and other diseases of the circulatory system: Secondary | ICD-10-CM | POA: Insufficient documentation

## 2016-09-22 DIAGNOSIS — R296 Repeated falls: Secondary | ICD-10-CM | POA: Insufficient documentation

## 2016-09-22 NOTE — Patient Instructions (Addendum)
Resume weighing daily and call for an overnight weight gain of > 2 pounds or a weekly weight gain of >5 pounds.  Follow up with [facility]  October 06, 2016  Pacemaker clinic: 11:30am Dr. Saralyn Pilar: 11:45 am

## 2016-09-22 NOTE — Progress Notes (Signed)
Patient ID: Gloria Jarvis, female    DOB: January 14, 1929, 83 y.o.   MRN: [id number]  HPI  Gloria Jarvis is a 83 y/o female with a history of syncope, renal insufficiency, lung cancer, HTN, hyperlipidemia, GERD, depression, arthritis, anxiety, anemia, remote tobacco use and chronic heart failure.   Last echo was done 09/12/16 and showed an EF of 35-40% along with moderate MR and moderate aortic insufficiency. Trivial AS. EF has improved from 25% on 11/01/15.   Admitted 09/11/16 due to bronchopneumonia and influenza A. Treated with tamiflu and levaquin. Cardiology consult was obtained. Diuretics were stopped due to acute kidney injury with recommendation of resumption after 3 days. Discharged to [facility] after 3 days. Was in the ED 08/29/16 after a mechanical fall after tripping over a curb and landing face first. No LOC. She was treated and then released.   She presents today for a follow-up visit although hasn't been seen since December 2016. She has fatigue and shortness of breath with little exertion but symptoms do improve after she sits and rests for a few minutes. No edema in her legs/abdomen. Multiple bruising noted on face, trunk and extremities after a recent fall. Currently living at [facility] and is not being weighed daily.   Past Medical History:  Diagnosis Date  . Anemia   . Anxiety   . Arthritis   . Congestive heart failure (CHF) ([place])   . Depressed   . Esophageal cancer ([place])   . GERD (gastroesophageal reflux disease)   . Hyperlipidemia   . Hypertension   . Lung cancer ([place])   . Renal insufficiency   . Syncope and collapse    Past Surgical History:  Procedure Laterality Date  . CARDIAC CATHETERIZATION    . CAROTID ENDARTERECTOMY    . ESOPHAGEAL DILATION    . INSERT / REPLACE / REMOVE PACEMAKER    . PARTIAL HYSTERECTOMY    . WRIST FRACTURE SURGERY     Family History  Problem Relation Age of Onset  . Heart disease Mother   . Heart disease Father   . Stroke Father    . COPD Sister    Social History  Substance Use Topics  . Smoking status: Former Smoker    Packs/day: 1.00    Years: 40.00    Types: Cigarettes    Quit date: 07/28/1987  . Smokeless tobacco: Never Used  . Alcohol use No   Allergies  Allergen Reactions  . Ace Inhibitors Cough  . Amlodipine Diarrhea and Nausea And Vomiting  . Entresto [Sacubitril-Valsartan] Other (See Comments)    Back pain  . Furosemide Other (See Comments)    Blood sugar dropped   Prior to Admission medications   Medication Sig Start Date End Date Taking? Authorizing Provider  acetaminophen (TYLENOL) 500 MG tablet Take 500 mg by mouth every 6 (six) hours as needed for headache.   Yes Historical Provider, MD  carvedilol (COREG) 12.5 MG tablet take 1 tablet by mouth twice a day 07/08/15  Yes Alisa Graff, FNP  Fluticasone-Salmeterol (ADVAIR) 250-50 MCG/DOSE AEPB Inhale 1 puff into the lungs 2 (two) times daily.   Yes Historical Provider, MD  magnesium oxide (MAG-OX) 400 (241.3 Mg) MG tablet Take 400 mg by mouth daily. 08/31/16  Yes Historical Provider, MD  omeprazole (PRILOSEC) 20 MG capsule Take 20 mg by mouth 2 (two) times daily before a meal.   Yes Historical Provider, MD  pravastatin (PRAVACHOL) 20 MG tablet Take 20 mg by mouth daily.  Yes Historical Provider, MD  predniSONE (DELTASONE) 20 MG tablet Take 20 mg by mouth daily with breakfast. 09/21/16 09/26/16 Yes Historical Provider, MD  senna (SENOKOT) 8.6 MG TABS tablet Take 2 tablets by mouth daily.   Yes Historical Provider, MD     Review of Systems  Constitutional: Positive for fatigue. Negative for appetite change.  HENT: Positive for postnasal drip. Negative for congestion and sore throat.   Eyes: Negative.   Respiratory: Positive for shortness of breath. Negative for cough and chest tightness.   Cardiovascular: Positive for palpitations. Negative for chest pain and leg swelling.  Gastrointestinal: Negative for abdominal distention and abdominal pain.   Endocrine: Negative.   Genitourinary: Negative.   Musculoskeletal: Negative for back pain and neck pain.  Skin: Positive for color change (multiple bruises).  Allergic/Immunologic: Negative.   Neurological: Positive for weakness (legs) and light-headedness. Negative for dizziness.  Hematological: Negative for adenopathy. Bruises/bleeds easily.  Psychiatric/Behavioral: Negative for dysphoric mood, sleep disturbance (sleeping on 2 pillows) and suicidal ideas. The patient is not nervous/anxious.    Vitals:   09/22/16 1029  BP: 138/60  Pulse: 80  Resp: 18  SpO2: 100%  Weight: 100 lb 2 oz (45.4 kg)  Height: '5\' 3"'$  (1.6 m)   Wt Readings from Last 3 Encounters:  09/22/16 100 lb 2 oz (45.4 kg)  09/14/16 89 lb 14.4 oz (40.8 kg)  08/29/16 100 lb (45.4 kg)   Lab Results  Component Value Date   CREATININE 2.87 (H) 09/14/2016   CREATININE 2.92 (H) 09/13/2016   CREATININE 3.14 (H) 09/12/2016   Physical Exam  Constitutional: She is oriented to person, place, and time. She appears well-developed and well-nourished.  HENT:  Head: Normocephalic and atraumatic.  Eyes: Conjunctivae are normal. Pupils are equal, round, and reactive to light.  Neck: Normal range of motion. Neck supple. No JVD present.  Cardiovascular: Normal rate.  An irregular rhythm present.  Pulmonary/Chest: Effort normal. She has no wheezes. She has no rales.  Abdominal: Soft. She exhibits no distension. There is no tenderness.  Musculoskeletal: She exhibits no edema or tenderness.  Neurological: She is alert and oriented to person, place, and time.  Skin: Skin is warm and dry. Ecchymosis (left forehead, throat, left cheek, left arm) noted.  Psychiatric: She has a normal mood and affect. Her behavior is normal. Thought content normal.  Nursing note and vitals reviewed.   Assessment & Plan:  1: Chronic heart failure with reduced ejection fraction- - NYHA class III - euvolemic - not being weighed daily. Order written  for facility to weigh patient daily and call for an overnight weight gain of >2 pounds or a weekly weight gain of >5 pounds. Weight up 11 pounds from discharge but family is really trying to have her eat more protein and fat.  - not adding salt to her food - has not seen cardiologist ([facility]) in awhile so an appointment was scheduled for 10/06/16  2: HTN- - BP looks good today - has had some renal insufficiency so currently not on an ACEi/ARB  3: Chronic atrial fibrillation- - rate controlled at this time - taking carvedilol at this time  4: Frequent falls- - patient fell a few weeks ago and is quite bruised on her left forehead, left cheekbone, throat, left arm and left knee - receiving PT and OT at [facility] - walking with her walker  Facility medication list reviewed.  Return here in 3 months or sooner for any questions/problems before then.

## 2016-09-23 DIAGNOSIS — I4891 Unspecified atrial fibrillation: Secondary | ICD-10-CM | POA: Insufficient documentation

## 2016-09-23 DIAGNOSIS — R296 Repeated falls: Secondary | ICD-10-CM | POA: Insufficient documentation

## 2016-09-29 ENCOUNTER — Other Ambulatory Visit: Payer: Self-pay | Admitting: Family

## 2016-10-26 ENCOUNTER — Emergency Department: Payer: Medicare HMO

## 2016-10-26 ENCOUNTER — Inpatient Hospital Stay
Admission: EM | Admit: 2016-10-26 | Discharge: 2016-10-28 | DRG: 371 | Disposition: A | Discharge status: Hospice | Payer: Medicare HMO | Attending: Internal Medicine | Admitting: Internal Medicine

## 2016-10-26 DIAGNOSIS — Z79899 Other long term (current) drug therapy: Secondary | ICD-10-CM

## 2016-10-26 DIAGNOSIS — I272 Pulmonary hypertension, unspecified: Secondary | ICD-10-CM | POA: Diagnosis present

## 2016-10-26 DIAGNOSIS — D696 Thrombocytopenia, unspecified: Secondary | ICD-10-CM | POA: Diagnosis present

## 2016-10-26 DIAGNOSIS — N179 Acute kidney failure, unspecified: Secondary | ICD-10-CM | POA: Diagnosis present

## 2016-10-26 DIAGNOSIS — I4510 Unspecified right bundle-branch block: Secondary | ICD-10-CM | POA: Diagnosis present

## 2016-10-26 DIAGNOSIS — E875 Hyperkalemia: Secondary | ICD-10-CM | POA: Diagnosis not present

## 2016-10-26 DIAGNOSIS — E785 Hyperlipidemia, unspecified: Secondary | ICD-10-CM | POA: Diagnosis present

## 2016-10-26 DIAGNOSIS — A0472 Enterocolitis due to Clostridium difficile, not specified as recurrent: Secondary | ICD-10-CM | POA: Diagnosis not present

## 2016-10-26 DIAGNOSIS — Z825 Family history of asthma and other chronic lower respiratory diseases: Secondary | ICD-10-CM

## 2016-10-26 DIAGNOSIS — E860 Dehydration: Secondary | ICD-10-CM | POA: Diagnosis present

## 2016-10-26 DIAGNOSIS — L89153 Pressure ulcer of sacral region, stage 3: Secondary | ICD-10-CM | POA: Diagnosis present

## 2016-10-26 DIAGNOSIS — Z66 Do not resuscitate: Secondary | ICD-10-CM | POA: Diagnosis present

## 2016-10-26 DIAGNOSIS — L8990 Pressure ulcer of unspecified site, unspecified stage: Secondary | ICD-10-CM | POA: Insufficient documentation

## 2016-10-26 DIAGNOSIS — I5022 Chronic systolic (congestive) heart failure: Secondary | ICD-10-CM | POA: Diagnosis present

## 2016-10-26 DIAGNOSIS — Z823 Family history of stroke: Secondary | ICD-10-CM

## 2016-10-26 DIAGNOSIS — K219 Gastro-esophageal reflux disease without esophagitis: Secondary | ICD-10-CM | POA: Diagnosis present

## 2016-10-26 DIAGNOSIS — Z515 Encounter for palliative care: Secondary | ICD-10-CM | POA: Diagnosis not present

## 2016-10-26 DIAGNOSIS — Z681 Body mass index (BMI) 19 or less, adult: Secondary | ICD-10-CM

## 2016-10-26 DIAGNOSIS — Z8249 Family history of ischemic heart disease and other diseases of the circulatory system: Secondary | ICD-10-CM

## 2016-10-26 DIAGNOSIS — I130 Hypertensive heart and chronic kidney disease with heart failure and stage 1 through stage 4 chronic kidney disease, or unspecified chronic kidney disease: Secondary | ICD-10-CM | POA: Diagnosis present

## 2016-10-26 DIAGNOSIS — E43 Unspecified severe protein-calorie malnutrition: Secondary | ICD-10-CM | POA: Diagnosis present

## 2016-10-26 DIAGNOSIS — R64 Cachexia: Secondary | ICD-10-CM | POA: Diagnosis present

## 2016-10-26 DIAGNOSIS — Z85118 Personal history of other malignant neoplasm of bronchus and lung: Secondary | ICD-10-CM | POA: Diagnosis not present

## 2016-10-26 DIAGNOSIS — Z7189 Other specified counseling: Secondary | ICD-10-CM | POA: Diagnosis not present

## 2016-10-26 DIAGNOSIS — N189 Chronic kidney disease, unspecified: Secondary | ICD-10-CM | POA: Diagnosis not present

## 2016-10-26 DIAGNOSIS — Z7951 Long term (current) use of inhaled steroids: Secondary | ICD-10-CM

## 2016-10-26 DIAGNOSIS — Z8501 Personal history of malignant neoplasm of esophagus: Secondary | ICD-10-CM

## 2016-10-26 DIAGNOSIS — I248 Other forms of acute ischemic heart disease: Secondary | ICD-10-CM | POA: Diagnosis present

## 2016-10-26 DIAGNOSIS — D631 Anemia in chronic kidney disease: Secondary | ICD-10-CM | POA: Diagnosis present

## 2016-10-26 DIAGNOSIS — R609 Edema, unspecified: Secondary | ICD-10-CM

## 2016-10-26 DIAGNOSIS — Z888 Allergy status to other drugs, medicaments and biological substances status: Secondary | ICD-10-CM

## 2016-10-26 DIAGNOSIS — N184 Chronic kidney disease, stage 4 (severe): Secondary | ICD-10-CM | POA: Diagnosis present

## 2016-10-26 DIAGNOSIS — Z87891 Personal history of nicotine dependence: Secondary | ICD-10-CM

## 2016-10-26 DIAGNOSIS — I4891 Unspecified atrial fibrillation: Secondary | ICD-10-CM | POA: Diagnosis present

## 2016-10-26 LAB — CBC
HEMATOCRIT: 33.8 % — AB (ref 35.0–47.0)
HEMOGLOBIN: 10.9 g/dL — AB (ref 12.0–16.0)
MCH: 33.8 pg (ref 26.0–34.0)
MCHC: 32.1 g/dL (ref 32.0–36.0)
MCV: 105.3 fL — AB (ref 80.0–100.0)
Platelets: 62 10*3/uL — ABNORMAL LOW (ref 150–440)
RBC: 3.21 MIL/uL — ABNORMAL LOW (ref 3.80–5.20)
RDW: 18.4 % — AB (ref 11.5–14.5)
WBC: 6 10*3/uL (ref 3.6–11.0)

## 2016-10-26 LAB — COMPREHENSIVE METABOLIC PANEL
ALT: 13 U/L — ABNORMAL LOW (ref 14–54)
ANION GAP: 7 (ref 5–15)
AST: 26 U/L (ref 15–41)
Albumin: 2.9 g/dL — ABNORMAL LOW (ref 3.5–5.0)
Alkaline Phosphatase: 69 U/L (ref 38–126)
BILIRUBIN TOTAL: 1 mg/dL (ref 0.3–1.2)
BUN: 72 mg/dL — ABNORMAL HIGH (ref 6–20)
CO2: 20 mmol/L — ABNORMAL LOW (ref 22–32)
Calcium: 8.1 mg/dL — ABNORMAL LOW (ref 8.9–10.3)
Chloride: 106 mmol/L (ref 101–111)
Creatinine, Ser: 4.63 mg/dL — ABNORMAL HIGH (ref 0.44–1.00)
GFR, EST AFRICAN AMERICAN: 9 mL/min — AB (ref >60)
GFR, EST NON AFRICAN AMERICAN: 8 mL/min — AB (ref >60)
Glucose, Bld: 102 mg/dL — ABNORMAL HIGH (ref 65–99)
POTASSIUM: 5.8 mmol/L — AB (ref 3.5–5.1)
Sodium: 133 mmol/L — ABNORMAL LOW (ref 135–145)
TOTAL PROTEIN: 5.9 g/dL — AB (ref 6.5–8.1)

## 2016-10-26 LAB — CLOSTRIDIUM DIFFICILE BY PCR: Toxigenic C. Difficile by PCR: POSITIVE — AB

## 2016-10-26 LAB — TROPONIN I
TROPONIN I: 0.33 ng/mL — AB (ref <0.03)
TROPONIN I: 0.27 ng/mL — AB (ref <0.03)
Troponin I: 0.39 ng/mL (ref <0.03)

## 2016-10-26 LAB — C DIFFICILE QUICK SCREEN W PCR REFLEX
C DIFFICILE (CDIFF) TOXIN: NEGATIVE
C Diff antigen: POSITIVE — AB

## 2016-10-26 LAB — MRSA PCR SCREENING: MRSA by PCR: NEGATIVE

## 2016-10-26 LAB — BRAIN NATRIURETIC PEPTIDE: B NATRIURETIC PEPTIDE 5: 2449 pg/mL — AB (ref 0.0–100.0)

## 2016-10-26 LAB — GLUCOSE, CAPILLARY: Glucose-Capillary: 87 mg/dL (ref 65–99)

## 2016-10-26 MED ORDER — TRAZODONE HCL 50 MG PO TABS: 25.0000 mg | ORAL_TABLET | Freq: Every evening | ORAL | Status: DC | PRN

## 2016-10-26 MED ORDER — INSULIN ASPART 100 UNIT/ML ~~LOC~~ SOLN
10.0000 [IU] | Freq: Once | SUBCUTANEOUS | Status: AC
  Administered 2016-10-26: 10 [IU] via INTRAVENOUS
  Filled 2016-10-26: qty 10

## 2016-10-26 MED ORDER — SODIUM CHLORIDE 0.9 % IV SOLN
INTRAVENOUS | Status: DC
  Administered 2016-10-26 – 2016-10-27 (×2): via INTRAVENOUS

## 2016-10-26 MED ORDER — DEXTROSE 50 % IV SOLN
50.0000 mL | Freq: Once | INTRAVENOUS | Status: AC
  Administered 2016-10-26: 50 mL via INTRAVENOUS
  Filled 2016-10-26: qty 50

## 2016-10-26 MED ORDER — VANCOMYCIN 50 MG/ML ORAL SOLUTION
125.0000 mg | Freq: Four times a day (QID) | ORAL | Status: DC
  Administered 2016-10-26 – 2016-10-28 (×7): 125 mg via ORAL
  Filled 2016-10-26 (×9): qty 2.5

## 2016-10-26 MED ORDER — MAGNESIUM OXIDE 400 (241.3 MG) MG PO TABS
400.0000 mg | ORAL_TABLET | Freq: Every day | ORAL | Status: DC
  Administered 2016-10-26 – 2016-10-27 (×2): 400 mg via ORAL
  Filled 2016-10-26 (×2): qty 1

## 2016-10-26 MED ORDER — SENNA 8.6 MG PO TABS
2.0000 | ORAL_TABLET | Freq: Every day | ORAL | Status: DC
  Filled 2016-10-26: qty 2

## 2016-10-26 MED ORDER — PANTOPRAZOLE SODIUM 40 MG PO TBEC
40.0000 mg | DELAYED_RELEASE_TABLET | Freq: Every day | ORAL | Status: DC
  Administered 2016-10-26 – 2016-10-27 (×2): 40 mg via ORAL
  Filled 2016-10-26 (×2): qty 1

## 2016-10-26 MED ORDER — MOMETASONE FURO-FORMOTEROL FUM 200-5 MCG/ACT IN AERO
2.0000 | INHALATION_SPRAY | Freq: Two times a day (BID) | RESPIRATORY_TRACT | Status: DC
  Administered 2016-10-26 – 2016-10-27 (×2): 2 via RESPIRATORY_TRACT
  Filled 2016-10-26 (×2): qty 8.8

## 2016-10-26 MED ORDER — CARVEDILOL 12.5 MG PO TABS
12.5000 mg | ORAL_TABLET | Freq: Two times a day (BID) | ORAL | Status: DC
  Administered 2016-10-26 – 2016-10-27 (×2): 12.5 mg via ORAL
  Filled 2016-10-26 (×3): qty 1

## 2016-10-26 MED ORDER — SODIUM CHLORIDE 0.9% FLUSH
3.0000 mL | Freq: Two times a day (BID) | INTRAVENOUS | Status: DC
  Administered 2016-10-27: 3 mL via INTRAVENOUS

## 2016-10-26 MED ORDER — PATIROMER SORBITEX CALCIUM 8.4 G PO PACK
8.4000 g | PACK | Freq: Every day | ORAL | Status: DC
  Administered 2016-10-26: 8.4 g via ORAL
  Filled 2016-10-26 (×3): qty 4

## 2016-10-26 MED ORDER — PRAVASTATIN SODIUM 20 MG PO TABS
20.0000 mg | ORAL_TABLET | Freq: Every day | ORAL | Status: DC
  Administered 2016-10-26 – 2016-10-27 (×2): 20 mg via ORAL
  Filled 2016-10-26 (×2): qty 1

## 2016-10-26 MED ORDER — ETHACRYNIC ACID 25 MG PO TABS
50.0000 mg | ORAL_TABLET | Freq: Once | ORAL | Status: AC
  Administered 2016-10-26: 50 mg via ORAL
  Filled 2016-10-26 (×2): qty 2

## 2016-10-26 MED ORDER — DOCUSATE SODIUM 100 MG PO CAPS
100.0000 mg | ORAL_CAPSULE | Freq: Two times a day (BID) | ORAL | Status: DC
  Administered 2016-10-26: 100 mg via ORAL
  Filled 2016-10-26 (×3): qty 1

## 2016-10-26 MED ORDER — HEPARIN SODIUM (PORCINE) 5000 UNIT/ML IJ SOLN
5000.0000 [IU] | Freq: Three times a day (TID) | INTRAMUSCULAR | Status: DC
  Administered 2016-10-26 – 2016-10-27 (×4): 5000 [IU] via SUBCUTANEOUS
  Filled 2016-10-26 (×3): qty 1

## 2016-10-26 MED ORDER — BISACODYL 5 MG PO TBEC: 5.0000 mg | DELAYED_RELEASE_TABLET | Freq: Every day | ORAL | Status: DC | PRN

## 2016-10-26 MED ORDER — ACETAMINOPHEN 500 MG PO TABS
500.0000 mg | ORAL_TABLET | Freq: Four times a day (QID) | ORAL | Status: DC | PRN
Start: 2016-10-26 — End: 2016-10-28

## 2016-10-26 MED ORDER — ONDANSETRON HCL 4 MG/2ML IJ SOLN: 4.0000 mg | Freq: Four times a day (QID) | INTRAMUSCULAR | Status: DC | PRN

## 2016-10-26 MED ORDER — ONDANSETRON HCL 4 MG PO TABS: 4.0000 mg | ORAL_TABLET | Freq: Four times a day (QID) | ORAL | Status: DC | PRN

## 2016-10-26 MED ORDER — SODIUM CHLORIDE 0.9 % IV SOLN
1.0000 g | Freq: Once | INTRAVENOUS | Status: AC
  Administered 2016-10-26: 1 g via INTRAVENOUS
  Filled 2016-10-26 (×2): qty 10

## 2016-10-26 NOTE — ED Triage Notes (Signed)
Pt comes into the ED via from [facility] with c/o generalized weakness and SOB for the past 3 days with noted weeping edema of the BL LE.Marland Kitchen

## 2016-10-26 NOTE — ED Notes (Signed)
Hooked pt back up to monitor 

## 2016-10-26 NOTE — ED Notes (Signed)
Pt transported to xray 

## 2016-10-26 NOTE — H&P (Signed)
[facility] NAME: Gloria Jarvis    MR#:  [id number]  DATE OF BIRTH:  1929-02-23  DATE OF ADMISSION:  10/26/2016  PRIMARY CARE PHYSICIAN: Rusty Aus, MD   REQUESTING/REFERRING PHYSICIAN: Orbie Pyo, MD  CHIEF COMPLAINT:   Chief Complaint  Patient presents with  . Weakness    HISTORY OF PRESENT ILLNESS:  Gloria Jarvis  is a 83 y.o. female with a known history of CHF, GERD, anxiety, and CKD stage III is presenting to the emergency department today with weakness over the past 2 weeks as well as swelling in her bilateral lower extremities as well as upper extremities is worsening over last 2-3 days, very noticeable since yesterday.  As per family. She says that her appetite is also down.  She was also noted to have worsening shortness of breath since yesterday.  She was admitted a few weeks ago for pneumonia and flu and was discharged to [facility].  She graduated there and was discharged to [facility] where she lives.  As of now.  She was evaluated by a physician there for about complaints and was requested to come to the emergency department. PAST MEDICAL HISTORY:   Past Medical History:  Diagnosis Date  . Anemia   . Anxiety   . Arthritis   . Congestive heart failure (CHF) ([place])   . Depressed   . Esophageal cancer ([place])   . GERD (gastroesophageal reflux disease)   . Hyperlipidemia   . Hypertension   . Lung cancer ([place])   . Renal insufficiency   . Syncope and collapse    PAST SURGICAL HISTORY:   Past Surgical History:  Procedure Laterality Date  . CARDIAC CATHETERIZATION    . CAROTID ENDARTERECTOMY    . ESOPHAGEAL DILATION    . INSERT / REPLACE / REMOVE PACEMAKER    . PARTIAL HYSTERECTOMY    . WRIST FRACTURE SURGERY     SOCIAL HISTORY:   Social History  Substance Use Topics  . Smoking status: Former Smoker    Packs/day: 1.00    Years: 40.00    Types: Cigarettes    Quit  date: 07/28/1987  . Smokeless tobacco: Never Used  . Alcohol use No    FAMILY HISTORY:   Family History  Problem Relation Age of Onset  . Heart disease Mother   . Heart disease Father   . Stroke Father   . COPD Sister     DRUG ALLERGIES:   Allergies  Allergen Reactions  . Ace Inhibitors Cough  . Amlodipine Diarrhea and Nausea And Vomiting  . Entresto [Sacubitril-Valsartan] Other (See Comments)    Back pain  . Furosemide Other (See Comments)    Blood sugar dropped    REVIEW OF SYSTEMS:   Review of Systems  Constitutional: Positive for malaise/fatigue and weight loss. Negative for chills and fever.  HENT: Negative for nosebleeds and sore throat.   Eyes: Negative for blurred vision.  Respiratory: Positive for shortness of breath. Negative for cough and wheezing.   Cardiovascular: Positive for leg swelling. Negative for chest pain, orthopnea and PND.  Gastrointestinal: Negative for abdominal pain, constipation, diarrhea, heartburn, nausea and vomiting.  Genitourinary: Negative for dysuria and urgency.  Musculoskeletal: Negative for back pain.  Skin: Negative for rash.  Neurological: Positive for weakness. Negative for dizziness, speech change, focal weakness and headaches.  Endo/Heme/Allergies: Does not bruise/bleed easily.  Psychiatric/Behavioral: Negative for depression.   MEDICATIONS AT  HOME:   Prior to Admission medications   Medication Sig Start Date End Date Taking? Authorizing Provider  acetaminophen (TYLENOL) 500 MG tablet Take 500 mg by mouth every 6 (six) hours as needed for headache.   Yes Historical Provider, MD  carvedilol (COREG) 12.5 MG tablet take 1 tablet by mouth twice a day 09/29/16  Yes Alisa Graff, FNP  Fluticasone-Salmeterol (ADVAIR) 250-50 MCG/DOSE AEPB Inhale 1 puff into the lungs 2 (two) times daily.   Yes Historical Provider, MD  magnesium oxide (MAG-OX) 400 (241.3 Mg) MG tablet Take 400 mg by mouth daily. 08/31/16  Yes Historical Provider, MD    omeprazole (PRILOSEC) 20 MG capsule Take 20 mg by mouth 2 (two) times daily before a meal.   Yes Historical Provider, MD  pravastatin (PRAVACHOL) 20 MG tablet Take 20 mg by mouth daily.   Yes Historical Provider, MD  senna (SENOKOT) 8.6 MG TABS tablet Take 2 tablets by mouth daily.    Historical Provider, MD   VITAL SIGNS:  Blood pressure (!) 127/48, pulse 77, temperature 97.6 F (36.4 C), temperature source Oral, resp. rate 20, height '5\' 3"'$  (1.6 m), weight 43.1 kg (95 lb), SpO2 94 %. PHYSICAL EXAMINATION:  Physical Exam  Constitutional: She is oriented to person, place, and time. She appears malnourished. She appears unhealthy. She appears cachectic. She has a sickly appearance.  HENT:  Head: Normocephalic and atraumatic.  Eyes: Conjunctivae and EOM are normal. Pupils are equal, round, and reactive to light.  Neck: Normal range of motion. Neck supple. No tracheal deviation present. No thyromegaly present.  Cardiovascular: Normal rate, regular rhythm and normal heart sounds.   Pulmonary/Chest: Effort normal and breath sounds normal. No respiratory distress. She has no wheezes. She exhibits no tenderness.  Abdominal: Soft. Bowel sounds are normal. She exhibits no distension. There is no tenderness.  Musculoskeletal: Normal range of motion. She exhibits edema.  Neurological: She is alert and oriented to person, place, and time. No cranial nerve deficit.  Skin: Skin is warm and dry. No rash noted.  Psychiatric: Mood and affect normal.   LABORATORY PANEL:   CBC  Recent Labs Lab 10/26/16 1247  WBC 6.0  HGB 10.9*  HCT 33.8*  PLT 62*   ------------------------------------------------------------------------------------------------------------------  Chemistries   Recent Labs Lab 10/26/16 1247  NA 133*  K 5.8*  CL 106  CO2 20*  GLUCOSE 102*  BUN 72*  CREATININE 4.63*  CALCIUM 8.1*  AST 26  ALT 13*  ALKPHOS 69  BILITOT 1.0    ------------------------------------------------------------------------------------------------------------------  Cardiac Enzymes  Recent Labs Lab 10/26/16 1247  TROPONINI 0.39*   ------------------------------------------------------------------------------------------------------------------  RADIOLOGY:  Dg Chest 2 View  Result Date: 10/26/2016 CLINICAL DATA:  Shortness of breath for 3 days EXAM: CHEST  2 VIEW COMPARISON:  Chest radiograph and chest CT September 11, 2016 FINDINGS: There is airspace consolidation in the left mid and lower lung zones with left pleural effusion. There is a small right pleural effusion with mild right base atelectasis. There is generalized cardiomegaly with pacemaker leads attached to the right atrium and right ventricle. There is atherosclerotic calcification throughout the aorta. There is mild pulmonary venous hypertension. No adenopathy. No bone lesions. IMPRESSION: Airspace consolidation left mid and lower lung zones concerning for pneumonia. There is cardiomegaly with pulmonary venous hypertension and small pleural effusions. There may be a degree of superimposed congestive heart failure. There is aortic atherosclerosis. Electronically Signed   By: Lowella Grip III M.D.   On: 10/26/2016 11:34  IMPRESSION AND PLAN:  83 year old female with a known history of CHF, GERD, anxiety, and CKD stage III is being admitted today with weakness over the past 2 weeks as well as swelling in her bilateral lower extremities as well as upper extremities is worsening over last 2-3 days,  * Acute on chronic kidney disease stage III - Creatinine up to 4.6 - Likely cardiorenal syndrome - We will start gentle hydration,  - Nephrology consultation - EDP has already discussed the case with Dr. Holley Raring  * Hyperkalemia - Dr. Holley Raring of nephrology recommends 1 dose of veltassa as well as ethacrynic acid and ED has already given.  * Anasarca - may need Lasix drip - We  will leave that decision up to nephrology  *Elevated troponin - Likely from supply demand ischemia from renal failure - We will do serial troponin to rule out  * Severe protein calorie malnutrition - Likely poor prognosis   All the records are reviewed and case discussed with ED provider. Management plans discussed with the patient, family (Discussed with Almyra Free, who is her daughter-in-law at bedside.  Her cell phone number is [phone].  Patient's healthcare power of attorney is Gaspar Bidding along with her other son) and they are in agreement.  CODE STATUS: DO NOT RESUSCITATE  TOTAL TIME TAKING CARE OF THIS PATIENT: 55 minutes.    Max Sane M.D on 10/26/2016 at 3:00 PM  Between 7am to 6pm - Pager - [phone]  After 6pm go to [email or web address] - Proofreader  [facility]  Office  [phone]  CC: Primary care physician; Rusty Aus, MD   Note: This dictation was prepared with Dragon dictation along with smaller phrase technology. Any transcriptional errors that result from this process are unintentional.

## 2016-10-26 NOTE — Progress Notes (Signed)
Family Meeting Note  Advance Directive:no  Today a meeting took place with the Patient and Daughter-in-law Gloria Jarvis at bedside.  The following clinical team members were present during this meeting:MD  The following were discussed:Patient's diagnosis: , Patient's progosis: < 12 months and Goals for treatment: DNR  Additional follow-up to be provided: Palliative care consultation, I have discussed the case with them.  They will see her tomorrow.  She may qualify for hospice while at [facility].  Patient is in agreement in wanting to be comfortable only.  She does not want dialysis.  Time spent during discussion:20 minutes  Max Sane, MD

## 2016-10-26 NOTE — ED Provider Notes (Addendum)
[facility] Emergency Department Provider Note  ____________________________________________   First MD Initiated Contact with Patient 10/26/16 1058     (approximate)  I have reviewed the triage vital signs and the nursing notes.   HISTORY  Chief Complaint Weakness   HPI Gloria Jarvis is a 83 y.o. female with a history of congestive heart failure as well as kidney failure who is presenting to the emergency department today with weakness over the past 3-4 days as well as swelling in her bilateral lower extremities as well as upper extremities. She says that her appetite is also down. She denies any pain and says that she has baseline shortness of breath after having lung cancer.  The patient says that she is now in remission. She is denying any fever or pain. Denies any nausea or vomiting.  The patient says that she does take a "fluid pill." However, there is none listed on her medication list.   Past Medical History:  Diagnosis Date  . Anemia   . Anxiety   . Arthritis   . Congestive heart failure (CHF) ([place])   . Depressed   . Esophageal cancer ([place])   . GERD (gastroesophageal reflux disease)   . Hyperlipidemia   . Hypertension   . Lung cancer ([place])   . Renal insufficiency   . Syncope and collapse     Patient Active Problem List   Diagnosis Date Noted  . Frequent falls 09/23/2016  . Atrial fibrillation ([place]) 09/23/2016  . ARF (acute renal failure) ([place]) 09/11/2016  . Hyperkalemia 06/14/2015  . Dysphagia 03/14/2015  . HTN (hypertension) 12/03/2014  . Chronic systolic heart failure ([place]) 10/27/2014  . Chronic kidney disease (CKD), stage IV (severe) ([place]) 10/12/2014  . Decreased body weight 03/05/2014  . Acid reflux 12/05/2013    Past Surgical History:  Procedure Laterality Date  . CARDIAC CATHETERIZATION    . CAROTID ENDARTERECTOMY    . ESOPHAGEAL DILATION    . INSERT / REPLACE / REMOVE PACEMAKER    . PARTIAL HYSTERECTOMY    . WRIST  FRACTURE SURGERY      Prior to Admission medications   Medication Sig Start Date End Date Taking? Authorizing Provider  acetaminophen (TYLENOL) 500 MG tablet Take 500 mg by mouth every 6 (six) hours as needed for headache.   Yes Historical Provider, MD  carvedilol (COREG) 12.5 MG tablet take 1 tablet by mouth twice a day 09/29/16  Yes Alisa Graff, FNP  Fluticasone-Salmeterol (ADVAIR) 250-50 MCG/DOSE AEPB Inhale 1 puff into the lungs 2 (two) times daily.   Yes Historical Provider, MD  magnesium oxide (MAG-OX) 400 (241.3 Mg) MG tablet Take 400 mg by mouth daily. 08/31/16  Yes Historical Provider, MD  omeprazole (PRILOSEC) 20 MG capsule Take 20 mg by mouth 2 (two) times daily before a meal.   Yes Historical Provider, MD  pravastatin (PRAVACHOL) 20 MG tablet Take 20 mg by mouth daily.   Yes Historical Provider, MD  senna (SENOKOT) 8.6 MG TABS tablet Take 2 tablets by mouth daily.    Historical Provider, MD    Allergies Ace inhibitors; Amlodipine; Entresto [sacubitril-valsartan]; and Furosemide  Family History  Problem Relation Age of Onset  . Heart disease Mother   . Heart disease Father   . Stroke Father   . COPD Sister     Social History Social History  Substance Use Topics  . Smoking status: Former Smoker    Packs/day: 1.00    Years: 40.00  Types: Cigarettes    Quit date: 07/28/1987  . Smokeless tobacco: Never Used  . Alcohol use No    Review of Systems Constitutional: No fever/chills Eyes: No visual changes. ENT: No sore throat. Cardiovascular: Denies chest pain. Respiratory: pt says she is at her baseline breathing difficulty Gastrointestinal: No abdominal pain.  No nausea, no vomiting.  No diarrhea.  No constipation. Genitourinary: Negative for dysuria. Musculoskeletal: Negative for back pain. Skin: Negative for rash. Neurological: Negative for headaches, focal weakness or numbness.  10-point ROS otherwise  negative.  ____________________________________________   PHYSICAL EXAM:  VITAL SIGNS: ED Triage Vitals  Enc Vitals Group     BP 10/26/16 1049 (!) 127/48     Pulse Rate 10/26/16 1049 77     Resp 10/26/16 1049 20     Temp 10/26/16 1049 97.6 F (36.4 C)     Temp Source 10/26/16 1049 Oral     SpO2 10/26/16 1049 94 %     Weight 10/26/16 1050 95 lb (43.1 kg)     Height 10/26/16 1050 '5\' 3"'$  (1.6 m)     Head Circumference --      Peak Flow --      Pain Score --      Pain Loc --      Pain Edu? --      Excl. in [place]? --     Constitutional: Alert and oriented. Well appearing and in no acute distress. Eyes: Conjunctivae are normal. PERRL. EOMI. Head: Atraumatic. Nose: No congestion/rhinnorhea. Mouth/Throat: Mucous membranes are moist.  Neck: No stridor.   Cardiovascular: Normal rate, regular rhythm. Grossly normal heart sounds.   Respiratory: Normal respiratory effort.  No retractions. Lungs CTAB. Gastrointestinal: Soft and nontender. No distention.  Musculoskeletal: No lower extremity tenderness.  Moderate bilateral and equal lower extremity edema with minimal bilateral upper extremity edema.  No joint effusions. Neurologic:  Normal speech and language. No gross focal neurologic deficits are appreciated.  Skin:  Skin is warm, dry and intact. No rash noted. Psychiatric: Mood and affect are normal. Speech and behavior are normal.  ____________________________________________   LABS (all labs ordered are listed, but only abnormal results are displayed)  Labs Reviewed  COMPREHENSIVE METABOLIC PANEL - Abnormal; Notable for the following:       Result Value   Sodium 133 (*)    Potassium 5.8 (*)    CO2 20 (*)    Glucose, Bld 102 (*)    BUN 72 (*)    Creatinine, Ser 4.63 (*)    Calcium 8.1 (*)    Total Protein 5.9 (*)    Albumin 2.9 (*)    ALT 13 (*)    GFR calc non Af Amer 8 (*)    GFR calc Af Amer 9 (*)    All other components within normal limits  BRAIN NATRIURETIC PEPTIDE  - Abnormal; Notable for the following:    B Natriuretic Peptide 2,449.0 (*)    All other components within normal limits  CBC - Abnormal; Notable for the following:    RBC 3.21 (*)    Hemoglobin 10.9 (*)    HCT 33.8 (*)    MCV 105.3 (*)    RDW 18.4 (*)    Platelets 62 (*)    All other components within normal limits  TROPONIN I - Abnormal; Notable for the following:    Troponin I 0.39 (*)    All other components within normal limits  URINALYSIS, COMPLETE (UACMP) WITH MICROSCOPIC   ____________________________________________  EKG  ED ECG REPORT I, Doran Stabler, the attending physician, personally viewed and interpreted this ECG.   Date: 10/26/2016  EKG Time: 1107  Rate: 80  Rhythm: Atrial paced rhythm.  Axis: Normal  Intervals:right bundle branch block  ST&T Change: No ST segment elevation or depression. Biphasic T waves throughout without any obvious inversion.   No significant change from 09/11/2016. ____________________________________________  RADIOLOGY  Angiocath insertion Performed by: Doran Stabler  Consent: Verbal consent obtained. Risks and benefits: risks, benefits and alternatives were discussed Time out: Immediately prior to procedure a "time out" was called to verify the correct patient, procedure, equipment, support staff and site/side marked as required.  Preparation: Patient was prepped and draped in the usual sterile fashion.  Vein Location: right basilic  Ultrasound Guided  Gauge: 20  Normal blood return and flush without difficulty Patient tolerance: Patient tolerated the procedure well with no immediate complications.    ____________________________________________   PROCEDURES  Procedure(s) performed:   CRITICAL CARE Performed by: Doran Stabler   Total critical care time: 35 minutes  Critical care time was exclusive of separately billable procedures and treating other patients.  Critical care was necessary  to treat or prevent imminent or life-threatening deterioration.  Critical care was time spent personally by me on the following activities: development of treatment plan with patient and/or surrogate as well as nursing, discussions with consultants, evaluation of patient's response to treatment, examination of patient, obtaining history from patient or surrogate, ordering and performing treatments and interventions, ordering and review of laboratory studies, ordering and review of radiographic studies, pulse oximetry and re-evaluation of patient's condition.   Procedures  Critical Care performed:   ____________________________________________   INITIAL IMPRESSION / ASSESSMENT AND PLAN / ED COURSE  Pertinent labs & imaging results that were available during my care of the patient were reviewed by me and considered in my medical decision making (see chart for details).  ----------------------------------------- 2:09 PM on 10/26/2016 -----------------------------------------  Patient found to be in acute renal failure which likely [place] edema as well as elevated troponin. No T-wave peaking or widening of the QRS Brown the patient's baseline right bundle branch block. Insulin as well as dextrose and calcium were ordered for the patient's hyperkalemia. The patient will be admitted to the hospital. Patient with a normal white blood cell count. Says that her breathing is at her baseline. We will hold on antibiotics for now because of [place] that the patient's symptoms are more likely explained by her renal failure and subsequent fluid overload. Signed out to Dr. Darvin Neighbours. Discussed this with the patient as well as family are understanding of this plan and willing to comply.      ____________________________________________   FINAL CLINICAL IMPRESSION(S) / ED DIAGNOSES  Acute on chronic renal failure. Hyperkalemia. Edema.    NEW MEDICATIONS STARTED DURING THIS VISIT:  New Prescriptions    No medications on file     Note:  This document was prepared using Dragon voice recognition software and may include unintentional dictation errors.    Orbie Pyo, MD 10/26/16 1411  Discussed case with Dr. Holley Raring of nephrology recommends 1 dose of veltassa as well as ethacrynic acid.    Orbie Pyo, MD 10/26/16 1415

## 2016-10-27 DIAGNOSIS — N189 Chronic kidney disease, unspecified: Secondary | ICD-10-CM

## 2016-10-27 DIAGNOSIS — Z7189 Other specified counseling: Secondary | ICD-10-CM

## 2016-10-27 DIAGNOSIS — A0472 Enterocolitis due to Clostridium difficile, not specified as recurrent: Secondary | ICD-10-CM

## 2016-10-27 DIAGNOSIS — Z515 Encounter for palliative care: Secondary | ICD-10-CM

## 2016-10-27 DIAGNOSIS — N179 Acute kidney failure, unspecified: Secondary | ICD-10-CM

## 2016-10-27 LAB — BASIC METABOLIC PANEL
ANION GAP: 5 (ref 5–15)
BUN: 73 mg/dL — ABNORMAL HIGH (ref 6–20)
CALCIUM: 7.8 mg/dL — AB (ref 8.9–10.3)
CO2: 20 mmol/L — ABNORMAL LOW (ref 22–32)
CREATININE: 4.86 mg/dL — AB (ref 0.44–1.00)
Chloride: 109 mmol/L (ref 101–111)
GFR calc Af Amer: 9 mL/min — ABNORMAL LOW (ref >60)
GFR, EST NON AFRICAN AMERICAN: 7 mL/min — AB (ref >60)
GLUCOSE: 87 mg/dL (ref 65–99)
Potassium: 5.9 mmol/L — ABNORMAL HIGH (ref 3.5–5.1)
Sodium: 134 mmol/L — ABNORMAL LOW (ref 135–145)

## 2016-10-27 LAB — CBC
HCT: 30.8 % — ABNORMAL LOW (ref 35.0–47.0)
HEMOGLOBIN: 9.9 g/dL — AB (ref 12.0–16.0)
MCH: 34 pg (ref 26.0–34.0)
MCHC: 32 g/dL (ref 32.0–36.0)
MCV: 106.1 fL — ABNORMAL HIGH (ref 80.0–100.0)
PLATELETS: 57 10*3/uL — AB (ref 150–440)
RBC: 2.9 MIL/uL — ABNORMAL LOW (ref 3.80–5.20)
RDW: 18.6 % — AB (ref 11.5–14.5)
WBC: 5.4 10*3/uL (ref 3.6–11.0)

## 2016-10-27 LAB — GLUCOSE, CAPILLARY: GLUCOSE-CAPILLARY: 92 mg/dL (ref 65–99)

## 2016-10-27 LAB — TROPONIN I: TROPONIN I: 0.36 ng/mL — AB (ref <0.03)

## 2016-10-27 MED ORDER — HALOPERIDOL 0.5 MG PO TABS: 0.5000 mg | ORAL_TABLET | ORAL | Status: DC | PRN

## 2016-10-27 MED ORDER — PATIROMER SORBITEX CALCIUM 8.4 G PO PACK
8.4000 g | PACK | Freq: Every day | ORAL | Status: DC
  Administered 2016-10-27: 8.4 g via ORAL
  Filled 2016-10-27: qty 4

## 2016-10-27 MED ORDER — ASPIRIN 81 MG PO CHEW: 81.0000 mg | CHEWABLE_TABLET | Freq: Every day | ORAL | Status: DC

## 2016-10-27 MED ORDER — LORAZEPAM 2 MG/ML IJ SOLN: 1.0000 mg | INTRAMUSCULAR | Status: DC | PRN

## 2016-10-27 MED ORDER — PREMIER PROTEIN SHAKE: 11.0000 [oz_av] | Freq: Two times a day (BID) | ORAL | Status: DC

## 2016-10-27 MED ORDER — SODIUM CHLORIDE 0.9 % IV SOLN: 250.0000 mL | INTRAVENOUS | Status: DC | PRN

## 2016-10-27 MED ORDER — MORPHINE SULFATE (CONCENTRATE) 10 MG/0.5ML PO SOLN: 5.0000 mg | ORAL | Status: DC | PRN

## 2016-10-27 MED ORDER — ZINC OXIDE 20 % EX OINT
TOPICAL_OINTMENT | CUTANEOUS | Status: DC | PRN
  Filled 2016-10-27: qty 28.35

## 2016-10-27 MED ORDER — SODIUM CHLORIDE 0.9% FLUSH
3.0000 mL | Freq: Two times a day (BID) | INTRAVENOUS | Status: DC
  Administered 2016-10-27 – 2016-10-28 (×2): 3 mL via INTRAVENOUS

## 2016-10-27 MED ORDER — POLYVINYL ALCOHOL 1.4 % OP SOLN
1.0000 [drp] | Freq: Four times a day (QID) | OPHTHALMIC | Status: DC | PRN
  Filled 2016-10-27: qty 15

## 2016-10-27 MED ORDER — HALOPERIDOL LACTATE 2 MG/ML PO CONC
0.5000 mg | ORAL | Status: DC | PRN
  Filled 2016-10-27: qty 0.3

## 2016-10-27 MED ORDER — LORAZEPAM 2 MG/ML PO CONC: 1.0000 mg | ORAL | Status: DC | PRN

## 2016-10-27 MED ORDER — BIOTENE DRY MOUTH MT LIQD: 15.0000 mL | OROMUCOSAL | Status: DC | PRN

## 2016-10-27 MED ORDER — LORAZEPAM 1 MG PO TABS: 1.0000 mg | ORAL_TABLET | ORAL | Status: DC | PRN

## 2016-10-27 MED ORDER — SODIUM POLYSTYRENE SULFONATE 15 GM/60ML PO SUSP
30.0000 g | Freq: Once | ORAL | Status: AC
  Administered 2016-10-27: 30 g via ORAL
  Filled 2016-10-27: qty 120

## 2016-10-27 MED ORDER — HALOPERIDOL LACTATE 5 MG/ML IJ SOLN: 0.5000 mg | INTRAMUSCULAR | Status: DC | PRN

## 2016-10-27 MED ORDER — SODIUM CHLORIDE 0.9% FLUSH: 3.0000 mL | INTRAVENOUS | Status: DC | PRN

## 2016-10-27 NOTE — Plan of Care (Signed)
Problem: Bowel/Gastric: Goal: Will not experience complications related to bowel motility Outcome: Progressing Cdiff + this shift.  Antibiotics initiated as ordered.  No further stools this shift.

## 2016-10-27 NOTE — Plan of Care (Signed)
Problem: Fluid Volume: Goal: Ability to maintain a balanced intake and output will improve Outcome: Not Progressing Remains edematous, incontinent of urinary output.  IVF maintained as ordered.  Denies respiratory distress.

## 2016-10-27 NOTE — Progress Notes (Signed)
Spoke with dr. Bridgett Larsson regarding bp 119/42 hr 60-70. Per md okay to give scheduled coreg 12.'5mg'$  po

## 2016-10-27 NOTE — Progress Notes (Addendum)
Spoke with patients son bill concerned about patient being placed on comfort measures wants to talk to palliative care. Text paged [facility], also called Mary L. Mary L not on [place] stated she will call marianne and have her call the unit. Need to make marianne aware that bill wants to speak with her.

## 2016-10-27 NOTE — Consult Note (Signed)
Consultation Note Date: 10/27/2016   Patient Name: Gloria Jarvis  DOB: 10-Mar-1929  MRN: [id number]  Age / Sex: 83 y.o., female  PCP: Rusty Aus, MD Referring Physician: Demetrios Loll, MD  Reason for Consultation: Establishing goals of care  HPI/Patient Profile: 83 y.o. female  with past medical history of lung cancer, esophageal cancer, CHF, and CKD III who was admitted on 10/26/2016 with acute on chronic renal failure, and c-diff diarrhea.  On admission the family discussed a potential discharge to Hospice with the Hospitalist physician. Ms.  Viar had a recent admission for influenza.  She was discharged to SNF and is now in memory care.  Clinical Assessment and Goals of Care: I met the patient at bedside early this morning.  She wakes easily to my voice and has no complaints of pain or discomfort.  She tells me that her "time" is coming soon.  I asked why she felt that way and she told me that she was very blue, and that everything she cared about had been taken away from her including her home and her car.  Aaron Edelman, her son and HCPOA, and I talked on the phone.  He was emotional and told me his mother had gone down hill quickly since they had put her in ALF.  He felt he had no choice but to put her in ALF because she was falling frequently.  He did feel she was very depressed, but also felt she was very ill.    We discussed full comfort care - discontinuing IVF, Oxygen, DVT prophylaxis, etc and adding medications only related to comfort.  Aaron Edelman was very supportive of the move to full comfort.  We discussed whether or not she would qualify for hospice house.  If she does not - she will need to go back to a facility with Hospice Services.  However, she seems to be willing herself to pass on, and I feel that with no life prolonging measures she may pass naturally in a relatively short period of time.    I committed to  Aaron Edelman that we would convert her to "full comfort only measures" and reassess for hospice house tomorrow.    Primary Decision Maker:  PATIENT and son Aaron Edelman    SUMMARY OF RECOMMENDATIONS    Code Status/Advance Care Planning:  DNR    Symptom Management:   Full comfort.    Discontinue life prolonging measures (oxygen, IVF, etc)  Focus on comfort including  Oral vancomycin for c-diff (will stop PPI, ducolax, colace, and stool softeners)  Morphine for pain and dyspnea  Ativan for anxiety  Haldol for agitation  Additional Recommendations (Limitations, Scope, Preferences):  Avoid Hospitalization and Full Comfort Care  Palliative Prophylaxis:   Frequent Pain Assessment, Palliative Wound Care and Turn Reposition  Psycho-social/Spiritual:   Desire for further Chaplaincy support: no thank you  Prognosis:   < 2 weeks given kidney failure (GFR of 7), falling, c-diff infection, heart failure, and lack of will to live  Discharge Planning: most likely a  hospice facility.  Will re-evaluate on 4/3      Primary Diagnoses: Present on Admission: . ARF (acute renal failure) ([place])   I have reviewed the medical record, interviewed the patient and family, and examined the patient. The following aspects are pertinent.  Past Medical History:  Diagnosis Date  . Anemia   . Anxiety   . Arthritis   . Congestive heart failure (CHF) ([place])   . Depressed   . Esophageal cancer ([place])   . GERD (gastroesophageal reflux disease)   . Hyperlipidemia   . Hypertension   . Lung cancer ([place])   . Renal insufficiency   . Syncope and collapse    Social History   Social History  . Marital status: Divorced    Spouse name: N/A  . Number of children: N/A  . Years of education: N/A   Social History Main Topics  . Smoking status: Former Smoker    Packs/day: 1.00    Years: 40.00    Types: Cigarettes    Quit date: 07/28/1987  . Smokeless tobacco: Never Used  . Alcohol use No  . Drug  use: No  . Sexual activity: Not Asked   Other Topics Concern  . None   Social History Narrative   Lives at home by herself. Has a walker to ambulate   Family History  Problem Relation Age of Onset  . Heart disease Mother   . Heart disease Father   . Stroke Father   . COPD Sister    Scheduled Meds: . carvedilol  12.5 mg Oral BID  . docusate sodium  100 mg Oral BID  . heparin  5,000 Units Subcutaneous Q8H  . magnesium oxide  400 mg Oral Daily  . mometasone-formoterol  2 puff Inhalation BID  . pantoprazole  40 mg Oral Daily  . pravastatin  20 mg Oral Daily  . senna  2 tablet Oral Daily  . sodium chloride flush  3 mL Intravenous Q12H  . sodium polystyrene  30 g Oral Once  . vancomycin  125 mg Oral QID   Continuous Infusions: . sodium chloride 50 mL/hr at 10/26/16 1755   PRN Meds:.acetaminophen, bisacodyl, ondansetron **OR** ondansetron (ZOFRAN) IV, traZODone Allergies  Allergen Reactions  . Ace Inhibitors Cough  . Amlodipine Diarrhea and Nausea And Vomiting  . Entresto [Sacubitril-Valsartan] Other (See Comments)    Back pain  . Furosemide Other (See Comments)    Blood sugar dropped   Review of Systems +sore bottom and diarrhea.  No other complaints of pain or discomfort currently.  She has had swelling in her arms and legs and worsening SOB for the past week.  Physical Exam  Thin, frail, chronically ill appearing female.  Awake, alert, coherent, cooperative. CV rrr (distant heart sounds) Resp cta no distress Abdomen thin firm, no bowel sounds noted. UE with anasarca evident.  Vital Signs: BP (!) 116/31 (BP Location: Right Arm)   Pulse 63   Temp 97.7 F (36.5 C) (Oral)   Resp 15   Ht 5' 3"  (1.6 m)   Wt 43.2 kg (95 lb 3.8 oz)   LMP  (LMP Unknown)   SpO2 100%   BMI 16.87 kg/m  Pain Assessment: No/denies pain     SpO2: SpO2: 100 % O2 Device:SpO2: 100 % O2 Flow Rate: .O2 Flow Rate (L/min): 2 L/min  IO: Intake/output summary:  Intake/Output Summary (Last 24  hours) at 10/27/16 0751 Last data filed at 10/27/16 0328  Gross per 24 hour  Intake  587.51 ml  Output                0 ml  Net           587.51 ml    LBM: Last BM Date: 10/26/16 Baseline Weight: Weight: 43.1 kg (95 lb) Most recent weight: Weight: 43.2 kg (95 lb 3.8 oz)     Palliative Assessment/Data:     Time In: 7:30 am Time Out: 8:00 am Time In: 2:00 Time Out: 2:40  Time Total: 70 min. Greater than 50%  of this time was spent counseling and coordinating care related to the above assessment and plan.  Signed by: Imogene Burn, PA-C Palliative Medicine Pager: [phone]  Please contact Palliative Medicine Team phone at [phone] for questions and concerns.  For individual provider: See Shea Evans

## 2016-10-27 NOTE — Care Management (Signed)
Patient presents to ED from [facility].  Admitted with acute on chronic renal failure with marked swelling in upper and lower extremities. has been evaluated by palliative care.  Patient is DNR and to be converted to comfort care of comfort care.  Attending relays that patient will be evaluated for hospice home criteria within the next 24 hours

## 2016-10-27 NOTE — Progress Notes (Addendum)
[place] [facility] [place] NAME: Gloria Jarvis    MR#:  [id number]  DATE OF BIRTH:  Jun 27, 1929  SUBJECTIVE:  CHIEF COMPLAINT:   Chief Complaint  Patient presents with  . Weakness   Generalized weakness with diarrhea, no nausea vomiting or abdominal pain. REVIEW OF SYSTEMS:  Review of Systems  Constitutional: Positive for malaise/fatigue. Negative for chills and fever.  HENT: Negative for congestion.   Eyes: Negative for blurred vision and double vision.  Respiratory: Negative for cough, shortness of breath and stridor.   Cardiovascular: Positive for leg swelling. Negative for chest pain and palpitations.  Gastrointestinal: Positive for diarrhea. Negative for abdominal pain, blood in stool, constipation, melena, nausea and vomiting.  Genitourinary: Negative for dysuria and hematuria.  Musculoskeletal: Negative for back pain.  Skin: Negative for itching and rash.  Neurological: Positive for weakness. Negative for dizziness, focal weakness, loss of consciousness and headaches.  Psychiatric/Behavioral: Negative for depression. The patient is not nervous/anxious.     DRUG ALLERGIES:   Allergies  Allergen Reactions  . Ace Inhibitors Cough  . Amlodipine Diarrhea and Nausea And Vomiting  . Entresto [Sacubitril-Valsartan] Other (See Comments)    Back pain  . Furosemide Other (See Comments)    Blood sugar dropped   VITALS:  Blood pressure (!) 119/42, pulse 70, temperature 97.5 F (36.4 C), resp. rate 18, height '5\' 3"'$  (1.6 m), weight 95 lb 3.8 oz (43.2 kg), SpO2 100 %. PHYSICAL EXAMINATION:  Physical Exam  Constitutional:  Malnutrition, looks fragile.  HENT:  Head: Normocephalic.  Dry oral mucosa.  Eyes: Conjunctivae and EOM are normal.  Neck: Normal range of motion. Neck supple. No JVD present. No tracheal deviation present.  Cardiovascular: Normal rate, regular rhythm and normal heart sounds.  Exam reveals no gallop.   No murmur  heard. Pulmonary/Chest: Effort normal and breath sounds normal. No respiratory distress. She has no wheezes. She has no rales.  Abdominal: Soft. Bowel sounds are normal. She exhibits no distension. There is no tenderness.  Musculoskeletal: Normal range of motion. She exhibits edema. She exhibits no tenderness.  Trace leg edema  Neurological: No cranial nerve deficit.  Skin: No rash noted. No erythema.  Psychiatric: Affect normal.   LABORATORY PANEL:  Female CBC  Recent Labs Lab 10/27/16 0607  WBC 5.4  HGB 9.9*  HCT 30.8*  PLT 57*   ------------------------------------------------------------------------------------------------------------------ Chemistries   Recent Labs Lab 10/26/16 1247 10/27/16 0607  NA 133* 134*  K 5.8* 5.9*  CL 106 109  CO2 20* 20*  GLUCOSE 102* 87  BUN 72* 73*  CREATININE 4.63* 4.86*  CALCIUM 8.1* 7.8*  AST 26  --   ALT 13*  --   ALKPHOS 69  --   BILITOT 1.0  --    RADIOLOGY:  No results found. ASSESSMENT AND PLAN:   83 year old female with a known history of CHF, GERD, anxiety, and CKD stage III is being admitted today with weakness over the past 2 weeks as well as swelling in her bilateral lower extremities as well as upper extremities is worsening over last 2-3 days,  * Acute on chronic kidney disease stage III, due to dehydration secondary to C. difficile colitis. - Creatinine up to 4.86, baseline 2.8. hydrate carefully as she has underlying congestive heart failure per Dr. Holley Raring  * Hyperkalemia, K still high at 5.9. Given Kayexalate, continue veltassa per Dr. Holley Raring. Follow up BMP.  * C. difficile colitis. Continue vancomycin by mouth 4 times  a day.  *Elevated troponin - Likely from supply demand ischemia from renal failure. Troponin is stable. Aspirin.  *Chronic systolic CHF with ejection fraction 35-40%. Stable.  * Severe protein calorie malnutrition - Likely poor prognosis, dietitian consult.  Anemia of chronic kidney  disease. Stable. Thrombocytopenia. Stable.  History of lung cancer and esophagus cancer.  I discussed with Dr. Holley Raring. Generalized weakness. PT evaluation.  Poor prognosis. Palliative care consult for possible hospice. All the records are reviewed and case discussed with Care Management/Social Worker. Management plans discussed with the patient, family and they are in agreement.  CODE STATUS: DNR  TOTAL TIME TAKING CARE OF THIS PATIENT: 42 minutes.   More than 50% of the time was spent in counseling/coordination of care: YES  POSSIBLE D/C IN 3 DAYS, DEPENDING ON CLINICAL CONDITION.   Demetrios Loll M.D on 10/27/2016 at 2:02 PM  Between 7am to 6pm - Pager - [phone]  After 6pm go to [email or web address] - Proofreader  [facility] [place] [facility]  Office  [phone]  CC: Primary care physician; Rusty Aus, MD  Note: This dictation was prepared with Dragon dictation along with smaller phrase technology. Any transcriptional errors that result from this process are unintentional.

## 2016-10-27 NOTE — Progress Notes (Signed)
Initial Nutrition Assessment  DOCUMENTATION CODES:   Severe malnutrition in context of chronic illness  INTERVENTION:  1. Premier protein BID, each supplement provides 160 calories and 30gm protein  NUTRITION DIAGNOSIS:   Malnutrition related to chronic illness as evidenced by severe depletion of muscle mass, severe depletion of body fat.  GOAL:   Patient will meet greater than or equal to 90% of their needs  MONITOR:   PO intake, I & O's, Labs, Weight trends, Supplement acceptance  REASON FOR ASSESSMENT:   Other (Comment) (Low BMI)    ASSESSMENT:   Gloria Jarvis  is a 83 y.o. female with a known history of CHF, GERD, anxiety, and CKD stage III is presenting to the emergency department today with weakness over the past 2 weeks as well as swelling in her bilateral lower extremities as well as upper extremities is worsening over last 2-3 days, very noticeable since yesterday  Saw Gloria Jarvis last time she was here. Weight up from 88# on previous admission r/t edema. PO poor x3 days - continues to have poor appetite - drank some soup but diarrhea really discouraged patient and overall she feels poor.  Continues to be difficult with food choices and supplements to help with nutrition status. Denies any issues chewing/swallowing/choking Nutrition-Focused physical exam completed. Findings are severe fat depletion, severe muscle depletion, and moderate edema.   Labs and medications reviewed: Na 134, K 5.9 Mag-Ox, Colace, Senokot NS @ 11m/hr  Diet Order:  Diet renal with fluid restriction Fluid restriction: 1200 mL Fluid; Room service appropriate? Yes; Fluid consistency: Thin  Skin:  Wound (see comment) (Stg II PU to sacrum)  Last BM:  10/26/2016  Height:   Ht Readings from Last 1 Encounters:  10/26/16 '5\' 3"'$  (1.6 m)    Weight:   Wt Readings from Last 1 Encounters:  10/27/16 95 lb 3.8 oz (43.2 kg)    Ideal Body Weight:  52.27 kg  BMI:  Body mass index is 16.87  kg/m.  Estimated Nutritional Needs:   Kcal:  1100-1300 calories  Protein:  43-52 gm  Fluid:  >/= 1.1L  EDUCATION NEEDS:   No education needs identified at this time  Gloria Anis Sharice Harriss, MS, RD LDN Inpatient Clinical Dietitian Pager [phone]

## 2016-10-27 NOTE — Consult Note (Signed)
[place] Nurse wound consult note Reason for Consult: stage 3 pressure injury to sacrum, POA Wound type:pressure Pressure Injury POA: Yes Measurement:3 cm x 3.2 cm x 0.2 cm  Wound MIW:OEHO and moist Drainage (amount, consistency, odor) minimal serosanguinous Periwound:intact Dressing procedure/placement/frequency:[place] sacral wound with NS and pat gently dry.  Apply calcium alginate to wound bed.  Cover with silicone foam.  Change every other day.   Will not follow at this time.  Please re-consult if needed.  Domenic Moras RN BSN [place] Pager [phone]

## 2016-10-27 NOTE — Clinical Social Work Note (Signed)
CSW was informed that patient is from [facility], however she may need to go to hospice facility depending on her progress.  CSW to continue to follow patient's progress throughout discharge planning.  Jones Broom. [place], MSW, [place]  10/27/2016 4:44 PM

## 2016-10-27 NOTE — Progress Notes (Signed)
[facility]  ROUNDING NOTE   Subjective:  Patient well known to from prior admission. We saw her then for acute renal failure. It appears that she's had recurrence of acute renal failure. Her baseline creatinine is 2.8 and currently creatinine is up to 4.6. She appears to have C. difficile colitis toxin in her stool. She has been started on IV fluid hydration with 0.9 normal saline at 50 cc per hour.   Objective:  Vital signs in last 24 hours:  Temp:  [97.5 F (36.4 C)-98 F (36.7 C)] 97.5 F (36.4 C) (04/03 0812) Pulse Rate:  [57-98] 70 (04/03 1127) Resp:  [14-22] 18 (04/03 1127) BP: (116-132)/(31-48) 119/42 (04/03 1127) SpO2:  [73 %-100 %] 100 % (04/03 0812) Weight:  [43.2 kg (95 lb 3.8 oz)] 43.2 kg (95 lb 3.8 oz) (04/03 0434)  Weight change:  Filed Weights   10/26/16 1050 10/27/16 0434  Weight: 43.1 kg (95 lb) 43.2 kg (95 lb 3.8 oz)    Intake/Output: I/O last 3 completed shifts: In: 587.5 [I.V.:477.5; IV Piggyback:110] Out: -    Intake/Output this shift:  No intake/output data recorded.  Physical Exam: General: cachetic  Head: Normocephalic, atraumatic. OM dry  Eyes: Anicteric  Neck: Supple, trachea midline  Lungs:  Clear to auscultation, normal effort  Heart: S1S2 irregular  Abdomen:  Soft, nontender, bowel sounds present  Extremities: trace peripheral edema.  Neurologic: Nonfocal, moving all four extremities  Skin: No lesions       Basic Metabolic Panel:  Recent Labs Lab 10/26/16 1247 10/27/16 0607  NA 133* 134*  K 5.8* 5.9*  CL 106 109  CO2 20* 20*  GLUCOSE 102* 87  BUN 72* 73*  CREATININE 4.63* 4.86*  CALCIUM 8.1* 7.8*    Liver Function Tests:  Recent Labs Lab 10/26/16 1247  AST 26  ALT 13*  ALKPHOS 69  BILITOT 1.0  PROT 5.9*  ALBUMIN 2.9*   No results for input(s): LIPASE, AMYLASE in the last 168 hours. No results for input(s): AMMONIA in the last 168 hours.  CBC:  Recent Labs Lab 10/26/16 1247 10/27/16 0607   WBC 6.0 5.4  HGB 10.9* 9.9*  HCT 33.8* 30.8*  MCV 105.3* 106.1*  PLT 62* 57*    Cardiac Enzymes:  Recent Labs Lab 10/26/16 1247 10/26/16 1633 10/26/16 1901 10/26/16 2334  TROPONINI 0.39* 0.33* 0.27* 0.36*    BNP: Invalid input(s): POCBNP  CBG:  Recent Labs Lab 10/26/16 1410 10/27/16 0803  GLUCAP 87 92    Microbiology: Results for orders placed or performed during the hospital encounter of 10/26/16  C difficile quick scan w PCR reflex     Status: Abnormal   Collection Time: 10/26/16  5:50 PM  Result Value Ref Range Status   C Diff antigen POSITIVE (A) NEGATIVE Final   C Diff toxin NEGATIVE NEGATIVE Final   C Diff interpretation Results are indeterminate. See PCR results.  Final  Clostridium Difficile by PCR     Status: Abnormal   Collection Time: 10/26/16  5:50 PM  Result Value Ref Range Status   Toxigenic C Difficile by pcr POSITIVE (A) NEGATIVE Final    Comment: Positive for toxigenic C. difficile with little to no toxin production. Only treat if clinical presentation suggests symptomatic illness.  MRSA PCR Screening     Status: None   Collection Time: 10/26/16  6:50 PM  Result Value Ref Range Status   MRSA by PCR NEGATIVE NEGATIVE Final    Comment:  The GeneXpert MRSA Assay (FDA approved for NASAL specimens only), is one component of a comprehensive MRSA colonization surveillance program. It is not intended to diagnose MRSA infection nor to guide or monitor treatment for MRSA infections.     Coagulation Studies: No results for input(s): LABPROT, INR in the last 72 hours.  Urinalysis: No results for input(s): COLORURINE, LABSPEC, PHURINE, GLUCOSEU, HGBUR, BILIRUBINUR, KETONESUR, PROTEINUR, UROBILINOGEN, NITRITE, LEUKOCYTESUR in the last 72 hours.  Invalid input(s): APPERANCEUR    Imaging: Dg Chest 2 View  Result Date: 10/26/2016 CLINICAL DATA:  Shortness of breath for 3 days EXAM: CHEST  2 VIEW COMPARISON:  Chest radiograph and chest CT  September 11, 2016 FINDINGS: There is airspace consolidation in the left mid and lower lung zones with left pleural effusion. There is a small right pleural effusion with mild right base atelectasis. There is generalized cardiomegaly with pacemaker leads attached to the right atrium and right ventricle. There is atherosclerotic calcification throughout the aorta. There is mild pulmonary venous hypertension. No adenopathy. No bone lesions. IMPRESSION: Airspace consolidation left mid and lower lung zones concerning for pneumonia. There is cardiomegaly with pulmonary venous hypertension and small pleural effusions. There may be a degree of superimposed congestive heart failure. There is aortic atherosclerosis. Electronically Signed   By: Lowella Grip III M.D.   On: 10/26/2016 11:34     Medications:   . sodium chloride 50 mL/hr at 10/27/16 1213   . carvedilol  12.5 mg Oral BID  . docusate sodium  100 mg Oral BID  . heparin  5,000 Units Subcutaneous Q8H  . magnesium oxide  400 mg Oral Daily  . mometasone-formoterol  2 puff Inhalation BID  . pantoprazole  40 mg Oral Daily  . patiromer  8.4 g Oral Daily  . pravastatin  20 mg Oral Daily  . senna  2 tablet Oral Daily  . sodium chloride flush  3 mL Intravenous Q12H  . vancomycin  125 mg Oral QID   acetaminophen, bisacodyl, ondansetron **OR** ondansetron (ZOFRAN) IV, traZODone, zinc oxide  Assessment/ Plan:  83 y.o. female with a PMHX of atrial fibrillation,GERD, hypertension, chronic systolic congestive heart failure EF 35-40%, chronic kidney disease IV, history of esophageal cancer status post surgery  1.  Recurrent acute renal failure likely due to poor PO intake, possibly due to C. Diff colitis. 2.  CKD stage IV baseline Cr 2.8 3.  Pulmonary hypertension. 4.  Anemia of CKD.  5.  Hyperkalemia.  Plan: Patient appears to have recurrent acute renal failure. This is likely secondary to poor by mouth intake and the possibility of C. difficile  colitis. From her last hospitalization her creatinine had come down to 2.8. Continue gentle IV fluid hydration with 0.9 normal saline however we will need to hydrate carefully as she has underlying congestive heart failure. Agree with the use of patiromer to treat hyeprkalemia.  Patient has stated that she would not want renal replacement therapy should her renal function continued to deteriorate. This is certainly reasonable given her multiple underlying comorbidities. Would also consider palliative care consultation.   LOS: 1 Antonette Hendricks 4/3/201812:40 PM

## 2016-10-27 NOTE — Progress Notes (Signed)
Spoke with Gloria Jarvis regarding the son bill. Made her aware the son wants to talk with her. Haynes Dage stated she would call and update bill

## 2016-10-28 DIAGNOSIS — E875 Hyperkalemia: Secondary | ICD-10-CM

## 2016-10-28 MED ORDER — VANCOMYCIN 50 MG/ML ORAL SOLUTION: 125.0000 mg | Freq: Four times a day (QID) | ORAL | Status: AC

## 2016-10-28 MED ORDER — POLYVINYL ALCOHOL 1.4 % OP SOLN: 1.0000 [drp] | Freq: Four times a day (QID) | OPHTHALMIC | 0 refills | Status: AC | PRN

## 2016-10-28 MED ORDER — HALOPERIDOL 0.5 MG PO TABS: 0.5000 mg | ORAL_TABLET | ORAL | Status: AC | PRN

## 2016-10-28 MED ORDER — ONDANSETRON HCL 4 MG PO TABS: 4.0000 mg | ORAL_TABLET | Freq: Four times a day (QID) | ORAL | 0 refills | Status: AC | PRN

## 2016-10-28 MED ORDER — LORAZEPAM 1 MG PO TABS: 1.0000 mg | ORAL_TABLET | ORAL | 0 refills | Status: AC | PRN

## 2016-10-28 MED ORDER — MORPHINE SULFATE (CONCENTRATE) 10 MG/0.5ML PO SOLN: 5.0000 mg | ORAL | Status: AC | PRN

## 2016-10-28 MED ORDER — PATIROMER SORBITEX CALCIUM 8.4 G PO PACK
16.8000 g | PACK | Freq: Every day | ORAL | Status: DC
  Filled 2016-10-28: qty 4

## 2016-10-28 NOTE — Discharge Summary (Signed)
[facility] NAME: Gloria Jarvis    MR#:  [id number]  DATE OF BIRTH:  24-Sep-1928  DATE OF ADMISSION:  10/26/2016   ADMITTING PHYSICIAN: Max Sane, MD  DATE OF DISCHARGE: 10/28/2016 PRIMARY CARE PHYSICIAN: Rusty Aus, MD   ADMISSION DIAGNOSIS:  Hyperkalemia [E87.5] Edema, unspecified type [R60.9] Acute renal failure superimposed on chronic kidney disease, unspecified CKD stage, unspecified acute renal failure type ([place]) [N17.9, N18.9] DISCHARGE DIAGNOSIS:  Active Problems:   ARF (acute renal failure) (HCC)   Pressure injury of skin   Acute renal failure superimposed on chronic kidney disease (HCC)   Clostridium difficile diarrhea   Palliative care encounter   Goals of care, counseling/discussion   Encounter for hospice care discussion  SECONDARY DIAGNOSIS:   Past Medical History:  Diagnosis Date  . Anemia   . Anxiety   . Arthritis   . Congestive heart failure (CHF) ([place])   . Depressed   . Esophageal cancer ([place])   . GERD (gastroesophageal reflux disease)   . Hyperlipidemia   . Hypertension   . Lung cancer ([place])   . Renal insufficiency   . Syncope and collapse    HOSPITAL COURSE:   83 year old female with a known history of CHF, GERD, anxiety, and CKD stage IIIis being admittedtoday with weakness over the past 2 weeksas well as swelling in her bilateral lower extremities as well as upper extremitiesis worsening over last 2-3 days,  * Acute on chronic kidney disease stage III, due to dehydration secondary to C. difficile colitis. - Creatinine up to 4.86, baseline 2.8. She was hydrated carefully as she has underlying congestive heart failure per Dr. Holley Raring  * Hyperkalemia, K still high at 5.9. Given Kayexalate, continue veltassa per Dr. Holley Raring.   * C. difficile colitis. Continue vancomycin by mouth 4 times a day.  *Elevated troponin - Likely from supply demand ischemia from renal failure. Troponin is  stable. She was on Aspirin.  *Chronic systolic CHF with ejection fraction 35-40%. Stable.  * Severe protein calorie malnutrition.  Anemia of chronic kidney disease. Stable. Thrombocytopenia. Stable.  History of lung cancer and esophagus cancer.  I discussed with Dr. Holley Raring, palliative care staff, CM and SW. Generalized weakness. Very poor prognosis. After discussion with patient's family members including her son, the patient made comfort care and will be discharge to hospice home today.  DISCHARGE CONDITIONS:  Poor  Prognosis, discharge to hospice home today. CONSULTS OBTAINED:  Treatment Team:  Anthonette Legato, MD DRUG ALLERGIES:   Allergies  Allergen Reactions  . Ace Inhibitors Cough  . Amlodipine Diarrhea and Nausea And Vomiting  . Entresto [Sacubitril-Valsartan] Other (See Comments)    Back pain  . Furosemide Other (See Comments)    Blood sugar dropped   DISCHARGE MEDICATIONS:   Allergies as of 10/28/2016      Reactions   Ace Inhibitors Cough   Amlodipine Diarrhea, Nausea And Vomiting   Entresto [sacubitril-valsartan] Other (See Comments)   Back pain   Furosemide Other (See Comments)   Blood sugar dropped      Medication List    STOP taking these medications   acetaminophen 500 MG tablet Commonly known as:  TYLENOL   carvedilol 12.5 MG tablet Commonly known as:  COREG   Fluticasone-Salmeterol 250-50 MCG/DOSE Aepb Commonly known as:  ADVAIR   magnesium oxide 400 (241.3 Mg) MG tablet Commonly known as:  MAG-OX   omeprazole 20 MG capsule Commonly known as:  PRILOSEC  pravastatin 20 MG tablet Commonly known as:  PRAVACHOL   senna 8.6 MG Tabs tablet Commonly known as:  SENOKOT     TAKE these medications   haloperidol 0.5 MG tablet Commonly known as:  HALDOL Take 1 tablet (0.5 mg total) by mouth every 4 (four) hours as needed for agitation (or delirium).   LORazepam 1 MG tablet Commonly known as:  ATIVAN Take 1 tablet (1 mg total) by  mouth every 4 (four) hours as needed for anxiety.   morphine CONCENTRATE 10 MG/0.5ML Soln concentrated solution Take 0.25 mLs (5 mg total) by mouth every 2 (two) hours as needed for moderate pain (or dyspnea).   ondansetron 4 MG tablet Commonly known as:  ZOFRAN Take 1 tablet (4 mg total) by mouth every 6 (six) hours as needed for nausea.   polyvinyl alcohol 1.4 % ophthalmic solution Commonly known as:  LIQUIFILM TEARS Place 1 drop into both eyes 4 (four) times daily as needed for dry eyes.   vancomycin 50 mg/mL oral solution Commonly known as:  VANCOCIN Take 2.5 mLs (125 mg total) by mouth 4 (four) times daily.        DISCHARGE INSTRUCTIONS:  See AVS.  If you experience worsening of your admission symptoms, develop shortness of breath, life threatening emergency, suicidal or homicidal thoughts you must seek medical attention immediately by calling 911 or calling your MD immediately  if symptoms less severe.  You Must read complete instructions/literature along with all the possible adverse reactions/side effects for all the Medicines you take and that have been prescribed to you. Take any new Medicines after you have completely understood and accpet all the possible adverse reactions/side effects.   Please note  You were cared for by a hospitalist during your hospital stay. If you have any questions about your discharge medications or the care you received while you were in the hospital after you are discharged, you can call the unit and asked to speak with the hospitalist on call if the hospitalist that took care of you is not available. Once you are discharged, your primary care physician will handle any further medical issues. Please note that NO REFILLS for any discharge medications will be authorized once you are discharged, as it is imperative that you return to your primary care physician (or establish a relationship with a primary care physician if you do not have one) for your  aftercare needs so that they can reassess your need for medications and monitor your lab values.    On the day of Discharge:  VITAL SIGNS:  Blood pressure (!) 125/38, pulse 70, temperature 98.2 F (36.8 C), temperature source Oral, resp. rate 16, height '5\' 3"'$  (1.6 m), weight 95 lb 3.8 oz (43.2 kg), SpO2 94 %. PHYSICAL EXAMINATION:  GENERAL:  83 y.o.-year-old patient lying in the bed with no acute distress. Malnutrition and fragile. EYES: Pupils equal, round, reactive to light and accommodation. No scleral icterus. Extraocular muscles intact.  HEENT: Head atraumatic, normocephalic. Oropharynx and nasopharynx clear.  NECK:  Supple, no jugular venous distention. No thyroid enlargement, no tenderness.  LUNGS: Normal breath sounds bilaterally, no wheezing, rales,rhonchi or crepitation. No use of accessory muscles of respiration.  CARDIOVASCULAR: S1, S2 normal. No murmurs, rubs, or gallops.  ABDOMEN: Soft, non-tender, non-distended. Bowel sounds present. No organomegaly or mass.  EXTREMITIES: No pedal edema, cyanosis, or clubbing.  NEUROLOGIC: Cranial nerves II through XII are intact. Muscle strength 4/5 in all extremities. Sensation intact. Gait not checked.  PSYCHIATRIC: The  patient is alert and oriented x 3.  SKIN: No obvious rash, lesion, or ulcer.  DATA REVIEW:   CBC  Recent Labs Lab 10/27/16 0607  WBC 5.4  HGB 9.9*  HCT 30.8*  PLT 57*    Chemistries   Recent Labs Lab 10/26/16 1247 10/27/16 0607  NA 133* 134*  K 5.8* 5.9*  CL 106 109  CO2 20* 20*  GLUCOSE 102* 87  BUN 72* 73*  CREATININE 4.63* 4.86*  CALCIUM 8.1* 7.8*  AST 26  --   ALT 13*  --   ALKPHOS 69  --   BILITOT 1.0  --      Microbiology Results  Results for orders placed or performed during the hospital encounter of 10/26/16  C difficile quick scan w PCR reflex     Status: Abnormal   Collection Time: 10/26/16  5:50 PM  Result Value Ref Range Status   C Diff antigen POSITIVE (A) NEGATIVE Final   C  Diff toxin NEGATIVE NEGATIVE Final   C Diff interpretation Results are indeterminate. See PCR results.  Final  Clostridium Difficile by PCR     Status: Abnormal   Collection Time: 10/26/16  5:50 PM  Result Value Ref Range Status   Toxigenic C Difficile by pcr POSITIVE (A) NEGATIVE Final    Comment: Positive for toxigenic C. difficile with little to no toxin production. Only treat if clinical presentation suggests symptomatic illness.  MRSA PCR Screening     Status: None   Collection Time: 10/26/16  6:50 PM  Result Value Ref Range Status   MRSA by PCR NEGATIVE NEGATIVE Final    Comment:        The GeneXpert MRSA Assay (FDA approved for NASAL specimens only), is one component of a comprehensive MRSA colonization surveillance program. It is not intended to diagnose MRSA infection nor to guide or monitor treatment for MRSA infections.     RADIOLOGY:  No results found.   Management plans discussed with the patient, her son and they are in agreement.  CODE STATUS: DNR   TOTAL TIME TAKING CARE OF THIS PATIENT: 37 minutes.    Demetrios Loll M.D on 10/28/2016 at 1:22 PM  Between 7am to 6pm - Pager - [phone]  After 6pm go to [email or web address] - Proofreader  [facility]  Office  [phone]  CC: Primary care physician; Rusty Aus, MD   Note: This dictation was prepared with Dragon dictation along with smaller phrase technology. Any transcriptional errors that result from this process are unintentional.

## 2016-10-28 NOTE — Clinical Social Work Note (Signed)
CSW spoke to patient's son Gloria Jarvis [phone] who said he and the family have decided they would like to go to [facility].  CSW contacted Hospice Home nurse liason who will review patient's information and speak to the family about going to hospice facility.  CSW to continue to follow patient's progress throughout discharge planning.  Jones Broom. [place], MSW, [place]  10/28/2016 11:27 AM

## 2016-10-28 NOTE — Clinical Social Work Note (Signed)
Patient to be d/c'ed today to [facility].   Patient and family agreeable to plans will transport via [facility] nurse liaison RN to call report.  Family is at bedside and plans for discharge to hospice facility.  Evette Cristal, MSW, [place]

## 2016-10-28 NOTE — Care Management Important Message (Signed)
Important Message  Patient Details  Name: Gloria Jarvis MRN: [id number] Date of Birth: 1929/06/17   Medicare Important Message Given:  Yes Signed notice given   Katrina Stack, RN 10/28/2016, 4:11 PM

## 2016-10-28 NOTE — Plan of Care (Signed)
Problem: Bowel/Gastric: Goal: Will not experience complications related to bowel motility Outcome: Progressing No further stools this shift.

## 2016-10-28 NOTE — Progress Notes (Signed)
Daily Progress Note   Patient Name: Gloria Jarvis       Date: 10/28/2016 DOB: 1929/01/19  Age: 83 y.o. MRN#: [id number] Attending Physician: Demetrios Loll, MD Primary Care Physician: Rusty Aus, MD Admit Date: 10/26/2016  Reason for Consultation/Follow-up: Establishing goals of care  Subjective: Patient in bed with friend and grand dtr at bedside.  She reports she is not hungry and hasn't eaten much of anything for a week.  She tells me again that she knows her time is very short and she is comfortable with that.   She wants to go to hospice house.  I spoke with her son Gloria Jarvis and dtr in law Gloria Jarvis on the phone late last evening.  They asked for a prognosis.  I told them she was at high risk for an acute event at any time, but that she most likely had weeks left to live.  The family is support of her desire to go to hospice house.  I spoke with Gloria Jarvis again today to tell him I had placed the social work order for hospice house evaluation.  He questioned when we might have an answer about disposition.  I replied - probably today and if there are beds she may go today.    Assessment: Cachectic female with increased work of breathing.  Accepting of EOL.  Kidney function very poor.  Potassium elevated.   Patient Profile/HPI: 83 y.o. female  with past medical history of lung cancer, esophageal cancer, CHF, and CKD III who was admitted on 10/26/2016 with acute on chronic renal failure, and c-diff diarrhea.  On admission the family discussed a potential discharge to Hospice with the Hospitalist physician. Gloria Jarvis had a recent admission for influenza.  She was discharged to SNF and is now in memory care.   Length of Stay: 2  Current Medications: Scheduled Meds:  . sodium chloride flush  3 mL Intravenous  Q12H  . sodium chloride flush  3 mL Intravenous Q12H  . vancomycin  125 mg Oral QID    Continuous Infusions:   PRN Meds: sodium chloride, acetaminophen, antiseptic oral rinse, haloperidol **OR** haloperidol **OR** haloperidol lactate, LORazepam **OR** LORazepam **OR** LORazepam, morphine CONCENTRATE **OR** morphine CONCENTRATE, ondansetron **OR** ondansetron (ZOFRAN) IV, polyvinyl alcohol, sodium chloride flush, traZODone, zinc oxide  Physical Exam  Cachectic frail female, sitting up in bed talking with family.  A&O.  Increased work of breathing CV rrr   Vital Signs: BP (!) 125/38 (BP Location: Left Arm)   Pulse 70   Temp 98.2 F (36.8 C) (Oral)   Resp 16   Ht '5\' 3"'$  (1.6 m)   Wt 43.2 kg (95 lb 3.8 oz)   LMP  (LMP Unknown)   SpO2 94%   BMI 16.87 kg/m  SpO2: SpO2: 94 % O2 Device: O2 Device: Not Delivered O2 Flow Rate: O2 Flow Rate (L/min): 2 L/min  Intake/output summary:  Intake/Output Summary (Last 24 hours) at 10/28/16 1043 Last data filed at 10/28/16 0858  Gross per 24 hour  Intake           764.16 ml  Output              200 ml  Net           564.16 ml   LBM: Last BM Date: 10/27/16 Baseline Weight: Weight: 43.1 kg (95 lb) Most recent weight: Weight: 43.2 kg (95 lb 3.8 oz)       Palliative Assessment/Data:    Flowsheet Rows     Most Recent Value  Intake Tab  Referral Department  Hospitalist  Unit at Time of Referral  Cardiac/Telemetry Unit  Palliative Care Primary Diagnosis  Nephrology  Date Notified  10/26/16  Palliative Care Type  New Palliative care  Reason for referral  Clarify Goals of Care, Pain, Non-pain Symptom  Date of Admission  10/26/16  Date first seen by Palliative Care  10/27/16  # of days Palliative referral response time  1 Day(s)  # of days IP prior to Palliative referral  0  Clinical Assessment  Palliative Performance Scale Score  30%  Psychosocial & Spiritual Assessment  Palliative Care Outcomes  Patient/Family meeting held?   Yes  Who was at the meeting?  patient and son (son on phone)  Palliative Care Outcomes  Changed to focus on comfort, Clarified goals of care, Counseled regarding hospice      Patient Active Problem List   Diagnosis Date Noted  . Acute renal failure superimposed on chronic kidney disease ([place])   . Clostridium difficile diarrhea   . Palliative care encounter   . Goals of care, counseling/discussion   . Encounter for hospice care discussion   . Pressure injury of skin 10/26/2016  . Frequent falls 09/23/2016  . Atrial fibrillation ([place]) 09/23/2016  . ARF (acute renal failure) ([place]) 09/11/2016  . Hyperkalemia 06/14/2015  . Dysphagia 03/14/2015  . HTN (hypertension) 12/03/2014  . Chronic systolic heart failure ([place]) 10/27/2014  . Chronic kidney disease (CKD), stage IV (severe) ([place]) 10/12/2014  . Decreased body weight 03/05/2014  . Acid reflux 12/05/2013    Palliative Care Plan    Recommendations/Plan:  Will place social work order for hospice house evaluation and potential transfer   Will d/c all medications not related to comfort   Goals of Care and Additional Recommendations:  Limitations on Scope of Treatment: Full Comfort Care  Code Status:  DNR  Prognosis:  Days to weeks  Discharge Planning:  Hospice facility   Care plan was discussed with  discussed attending MD, Hospice Liaison, Nephrology and social work   Thank you for allowing the Palliative Medicine Team to assist in the care of this patient.  Total time spent:  35 min.     Greater than 50%  of this time was spent counseling and coordinating care related  to the above assessment and plan.  Imogene Burn, PA-C Palliative Medicine  Please contact Palliative MedicineTeam phone at [phone] for questions and concerns between 7 am - 7 pm.   Please see AMION for individual provider pager numbers.

## 2016-10-28 NOTE — Plan of Care (Signed)
Problem: Fluid Volume: Goal: Ability to maintain a balanced intake and output will improve Outcome: Not Progressing Oral intake and urinary output minimal this shift.

## 2016-10-28 NOTE — Progress Notes (Addendum)
[facility] referral received from [place]. Gloria Jarvis is an 83 year old woman with a known history of CHF, GERD, anxiety, CKD stage III, lung Cancer and esophageal cancer, admitted to [facility] for evaluation of weakness and bilateral upper and  lower extremity edema as well as worsening shortness of breath. She was found to be in acute renal failure with hyperkalemia and anasarca, she also was positive for Cdif and has received vancomycin. Palliative Medicine was consulted for goals of care and met with patient and her son. They have chosen to focus on her comfort at the Hospice home. Writer met in the room with Gloria Jarvis, her son Gloria Jarvis and friend Gloria Jarvis to intitiate education regarding hospice services, philosophy and team approach to care with good understanding voiced. Questions answered, consents signed. Patient and hospital care team all in agreement for transfer to the Hospice home today via EMS. Signed DNR in place in patient's chart. Patient's information faxed to referral, report called to the hospice home, EMS notified for transport. Thank you. Flo Shanks RN, BSN, [facility] [place], hospital Liaison [phone] c

## 2016-10-28 NOTE — Progress Notes (Signed)
[facility]  ROUNDING NOTE   Subjective:  No new renal function testing from today. Yesterday BUN was up to 73 with a creatinine of 4.8 and high potassium of 5.9. Patient resting comfortably in bed again today. Notes from yesterday reviewed. It appears that the family and patient would like to proceed with comfort and hospice care.   Objective:  Vital signs in last 24 hours:  Temp:  [97.9 F (36.6 C)-98.2 F (36.8 C)] 98.2 F (36.8 C) (04/04 0449) Pulse Rate:  [68-70] 70 (04/04 0449) Resp:  [16] 16 (04/04 0449) BP: (114-125)/(38-39) 125/38 (04/04 0449) SpO2:  [94 %-97 %] 94 % (04/04 0449)  Weight change:  Filed Weights   10/26/16 1050 10/27/16 0434  Weight: 43.1 kg (95 lb) 43.2 kg (95 lb 3.8 oz)    Intake/Output: I/O last 3 completed shifts: In: 1241.7 [P.O.:200; I.V.:1041.7] Out: 200 [Urine:200]   Intake/Output this shift:  No intake/output data recorded.  Physical Exam: General: cachetic  Head: Normocephalic, atraumatic. OM dry  Eyes: Anicteric  Neck: Supple, trachea midline  Lungs:  Clear to auscultation, normal effort  Heart: S1S2 irregular  Abdomen:  Soft, nontender, bowel sounds present  Extremities: trace peripheral edema.  Neurologic: Nonfocal, moving all four extremities  Skin: No lesions       Basic Metabolic Panel:  Recent Labs Lab 10/26/16 1247 10/27/16 0607  NA 133* 134*  K 5.8* 5.9*  CL 106 109  CO2 20* 20*  GLUCOSE 102* 87  BUN 72* 73*  CREATININE 4.63* 4.86*  CALCIUM 8.1* 7.8*    Liver Function Tests:  Recent Labs Lab 10/26/16 1247  AST 26  ALT 13*  ALKPHOS 69  BILITOT 1.0  PROT 5.9*  ALBUMIN 2.9*   No results for input(s): LIPASE, AMYLASE in the last 168 hours. No results for input(s): AMMONIA in the last 168 hours.  CBC:  Recent Labs Lab 10/26/16 1247 10/27/16 0607  WBC 6.0 5.4  HGB 10.9* 9.9*  HCT 33.8* 30.8*  MCV 105.3* 106.1*  PLT 62* 57*    Cardiac Enzymes:  Recent Labs Lab  10/26/16 1247 10/26/16 1633 10/26/16 1901 10/26/16 2334  TROPONINI 0.39* 0.33* 0.27* 0.36*    BNP: Invalid input(s): POCBNP  CBG:  Recent Labs Lab 10/26/16 1410 10/27/16 0803  GLUCAP 87 92    Microbiology: Results for orders placed or performed during the hospital encounter of 10/26/16  C difficile quick scan w PCR reflex     Status: Abnormal   Collection Time: 10/26/16  5:50 PM  Result Value Ref Range Status   C Diff antigen POSITIVE (A) NEGATIVE Final   C Diff toxin NEGATIVE NEGATIVE Final   C Diff interpretation Results are indeterminate. See PCR results.  Final  Clostridium Difficile by PCR     Status: Abnormal   Collection Time: 10/26/16  5:50 PM  Result Value Ref Range Status   Toxigenic C Difficile by pcr POSITIVE (A) NEGATIVE Final    Comment: Positive for toxigenic C. difficile with little to no toxin production. Only treat if clinical presentation suggests symptomatic illness.  MRSA PCR Screening     Status: None   Collection Time: 10/26/16  6:50 PM  Result Value Ref Range Status   MRSA by PCR NEGATIVE NEGATIVE Final    Comment:        The GeneXpert MRSA Assay (FDA approved for NASAL specimens only), is one component of a comprehensive MRSA colonization surveillance program. It is not intended to diagnose MRSA infection nor  to guide or monitor treatment for MRSA infections.     Coagulation Studies: No results for input(s): LABPROT, INR in the last 72 hours.  Urinalysis: No results for input(s): COLORURINE, LABSPEC, PHURINE, GLUCOSEU, HGBUR, BILIRUBINUR, KETONESUR, PROTEINUR, UROBILINOGEN, NITRITE, LEUKOCYTESUR in the last 72 hours.  Invalid input(s): APPERANCEUR    Imaging: No results found.   Medications:    . sodium chloride flush  3 mL Intravenous Q12H  . sodium chloride flush  3 mL Intravenous Q12H  . vancomycin  125 mg Oral QID   sodium chloride, acetaminophen, antiseptic oral rinse, haloperidol **OR** haloperidol **OR** haloperidol  lactate, LORazepam **OR** LORazepam **OR** LORazepam, morphine CONCENTRATE **OR** morphine CONCENTRATE, ondansetron **OR** ondansetron (ZOFRAN) IV, polyvinyl alcohol, sodium chloride flush, traZODone, zinc oxide  Assessment/ Plan:  83 y.o. female with a PMHX of atrial fibrillation,GERD, hypertension, chronic systolic congestive heart failure EF 35-40%, chronic kidney disease IV, history of esophageal cancer status post surgery  1.  Recurrent acute renal failure likely due to poor PO intake, possibly due to C. Diff colitis. 2.  CKD stage IV baseline Cr 2.8 3.  Pulmonary hypertension. 4.  Anemia of CKD.  5.  Hyperkalemia.  Plan: Overall the patient is quite debilitated has multiple medical problems including atrial fibrillation, congestive heart failure, chronic kidney disease stage IV with a history of esophageal cancer as well. Her renal function continues to decline and yesterday BUN was 73 with a creatinine of 4.8. Serum potassium was also high at 5.9. I had another long discussion with the patient today. She reaffirms her desire for no renal replacement therapy which is certainly reasonable given her underlying multiple comorbidities. Care discussed with palliative care team as well. It appears that she will be accepted at the hospice home.  Thanks for allowing [place] to participate in her care today.  Today's care required discussion and coordination with multiple providers.   LOS: 2 Martell Mcfadyen 4/4/201812:21 PM

## 2016-10-28 NOTE — Discharge Instructions (Signed)
[facility] Failure Clinic appointment on November 06, 2016 at 11:40am with Darylene Price, [place]. Please call [phone] to reschedule.  Hospice home.

## 2016-11-06 ENCOUNTER — Ambulatory Visit: Payer: Medicare HMO | Admitting: Family

## 2016-11-24 DEATH — deceased

## 2016-12-14 ENCOUNTER — Ambulatory Visit: Payer: Medicare HMO | Admitting: Family

## 2018-06-06 IMAGING — DX DG KNEE 3 VIEWS*L*
3 series · 3 of 3 positions shown · non-contrast
Comparison: None.

CLINICAL DATA: Pain after fall.

EXAM:
LEFT KNEE - 3 VIEW

[knee ap]
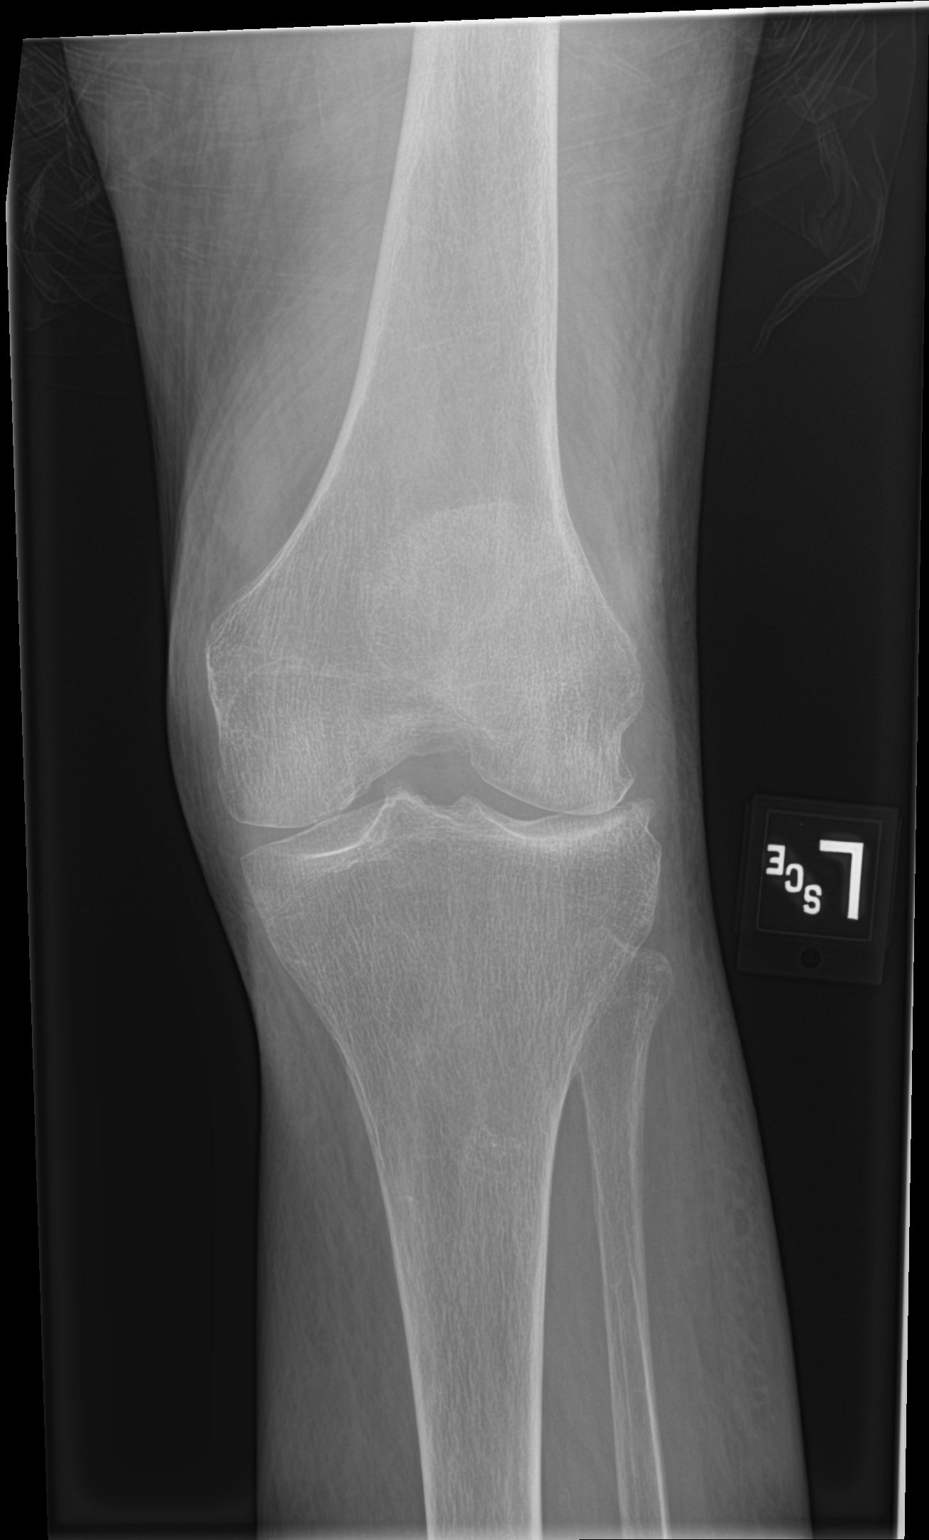

[knee lat]
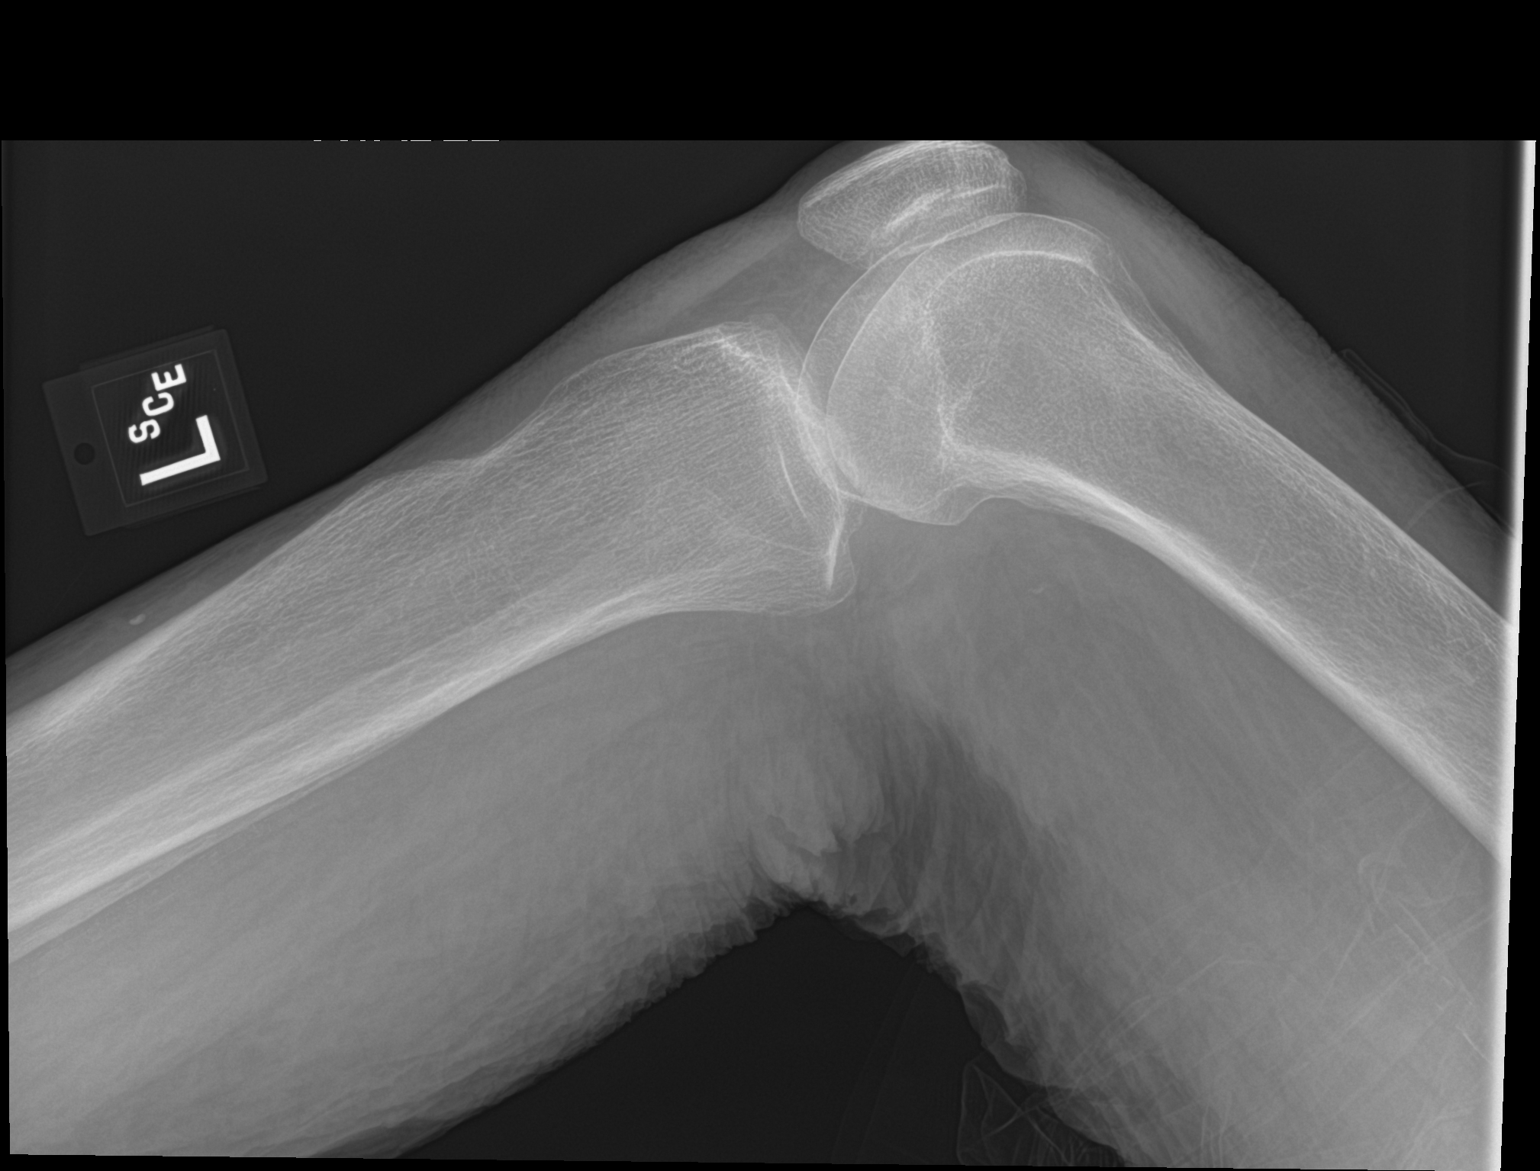

[knee obl]
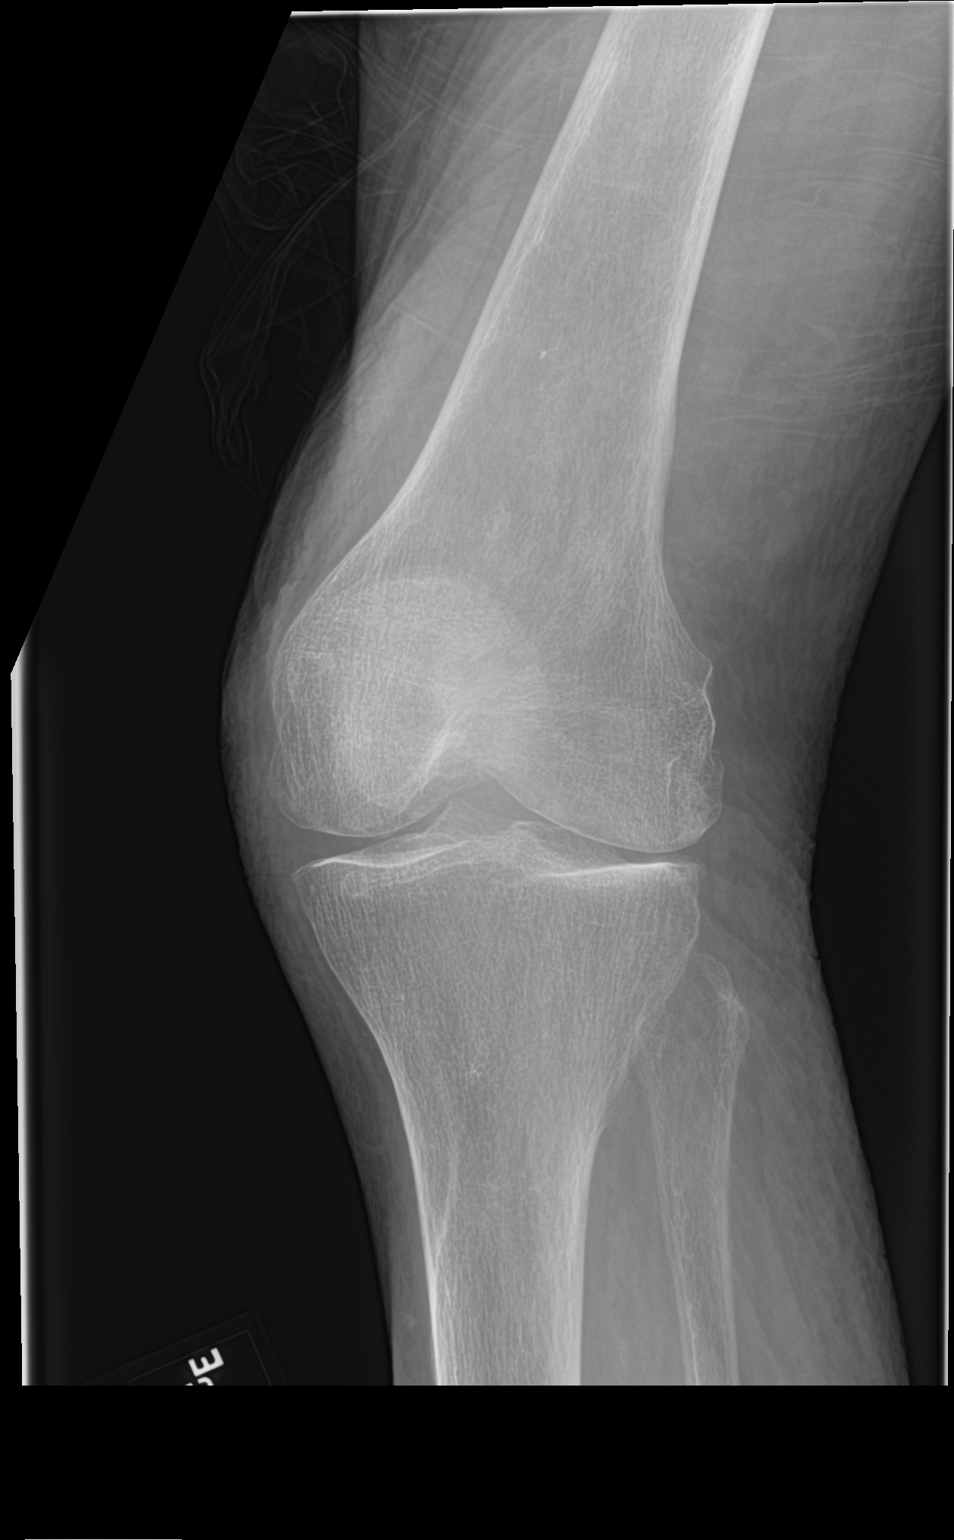

[3 of 3 positions shown; findings below may reference images not displayed]

FINDINGS: No evidence of fracture, dislocation, or joint effusion. No evidence
of arthropathy or other focal bone abnormality. Soft tissues are
unremarkable.
IMPRESSION: Negative.

## 2018-06-06 IMAGING — CT CT HEAD W/O CM
4 of 8 series · 15 of 47 positions shown, 16 images · non-contrast
Comparison: CT head 08/20/2014.

CLINICAL DATA: Fall today with head injury. Left forehead bruising.
History of esophageal and lung cancer.

EXAM:
CT HEAD WITHOUT CONTRAST
CT CERVICAL SPINE WITHOUT CONTRAST
TECHNIQUE: Multidetector CT imaging of the head and cervical spine was
performed following the standard protocol without intravenous
contrast. Multiplanar CT image reconstructions of the cervical spine
were also generated.

[Series 2: head wo · axial · 0.47mm/px · z∈[-192,-47]mm · 3 of 30 slices shown, 4 images]
[im 1/30  brain]
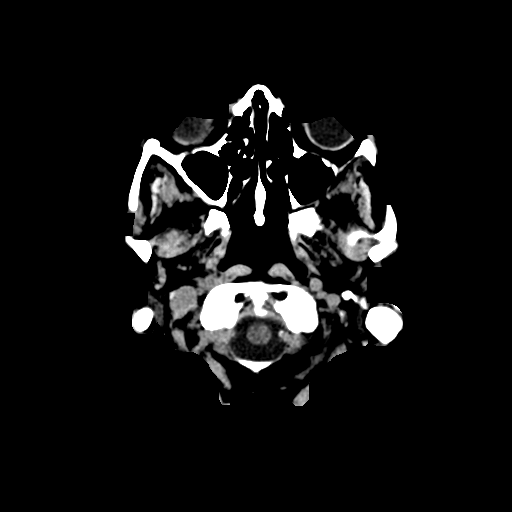
[im 1/30  bone]
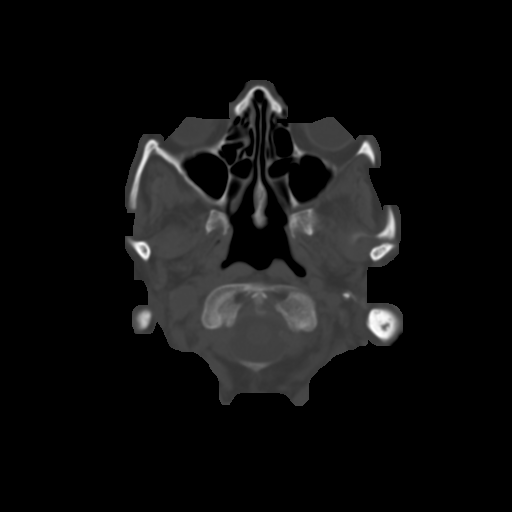
[im 15/30  brain]
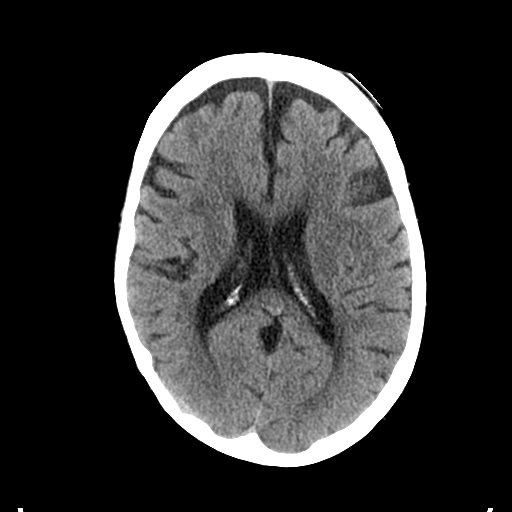
[im 30/30  brain]
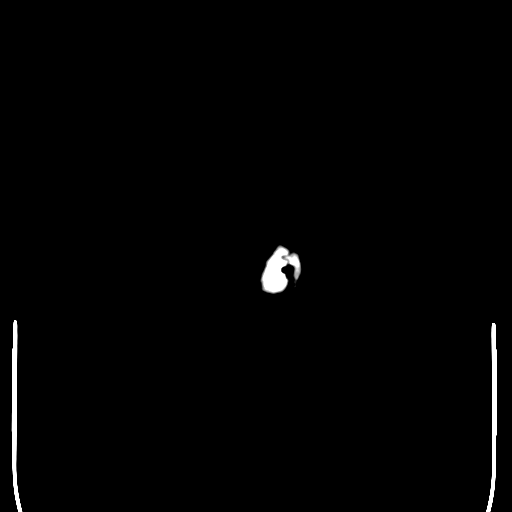

[Series 4: coronal soft tissue · coronal · 0.29mm/px · 3 of 65 slices shown]
[im 22/65  brain]
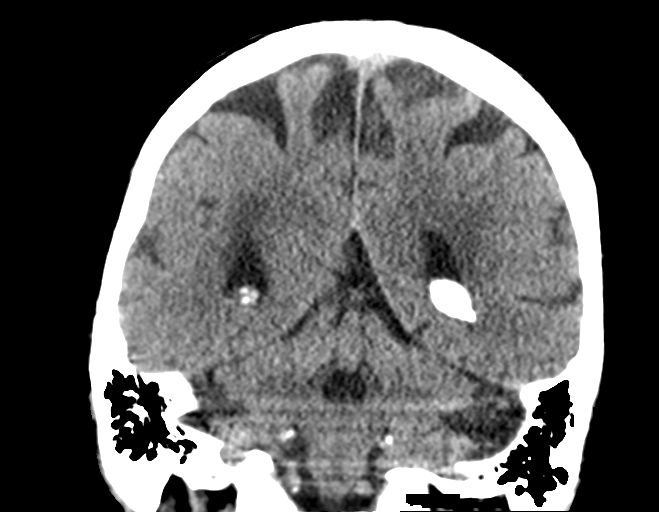
[im 33/65  brain]
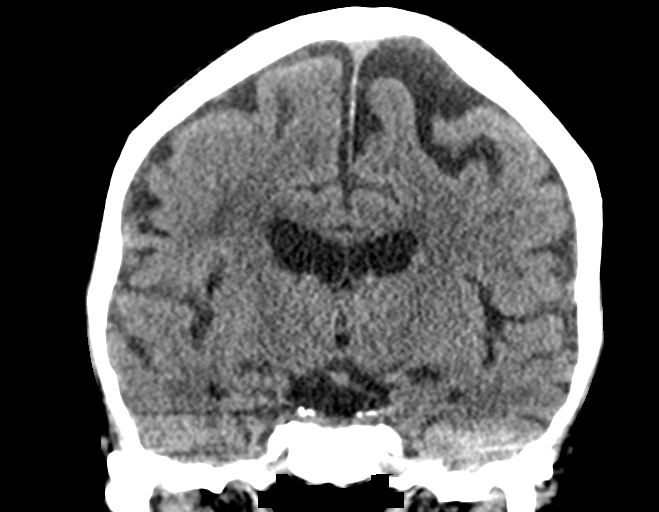
[im 43/65  brain]
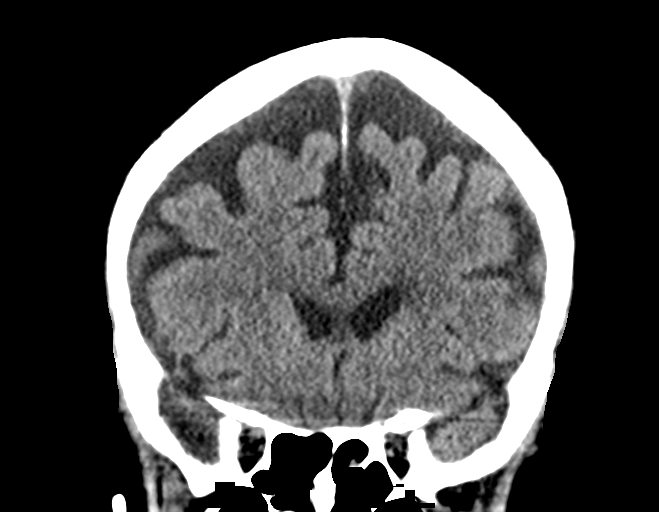

[Series 5: sagittal soft tissue · sagittal · 0.29mm/px · 1 of 49 slices shown]
[im 25/49  brain]
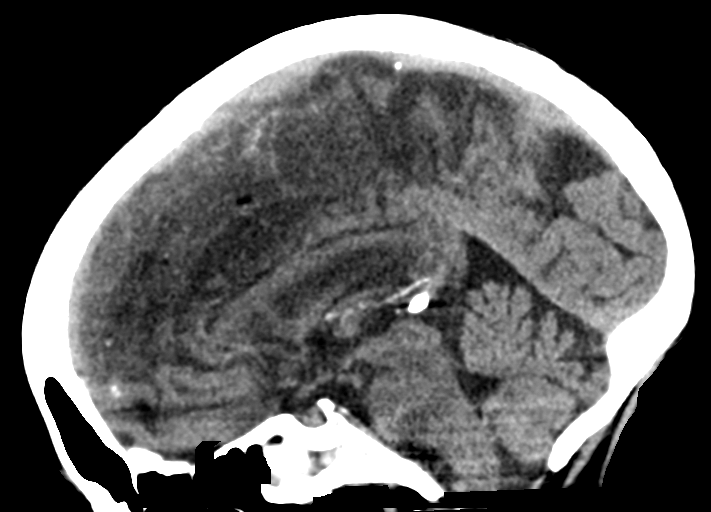

[Series 12: orthogonal bone · axial · 0.28mm/px · z∈[-341,-204]mm · 8 of 97 slices shown]
[im 10/97  bone]
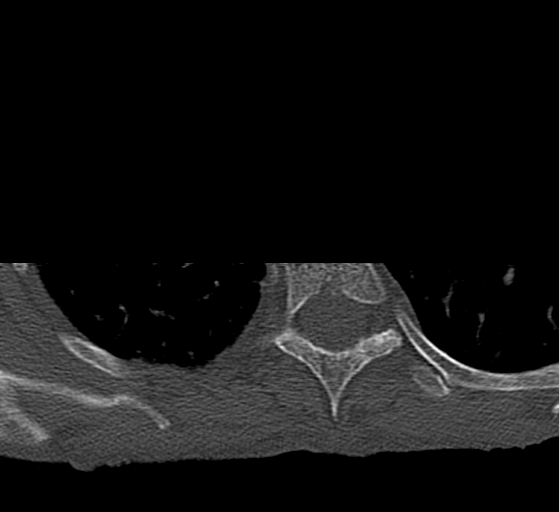
[im 20/97  bone]
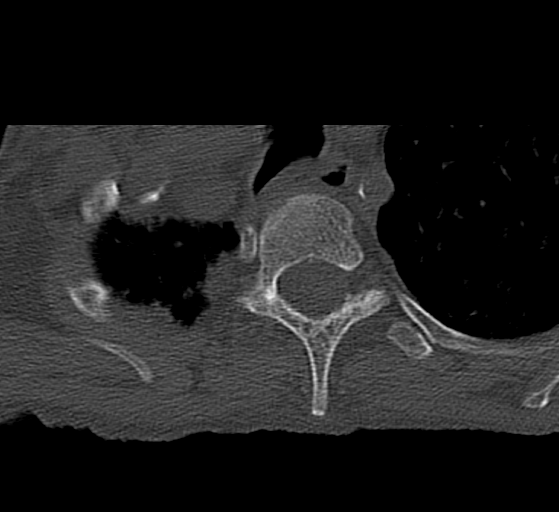
[im 29/97  bone]
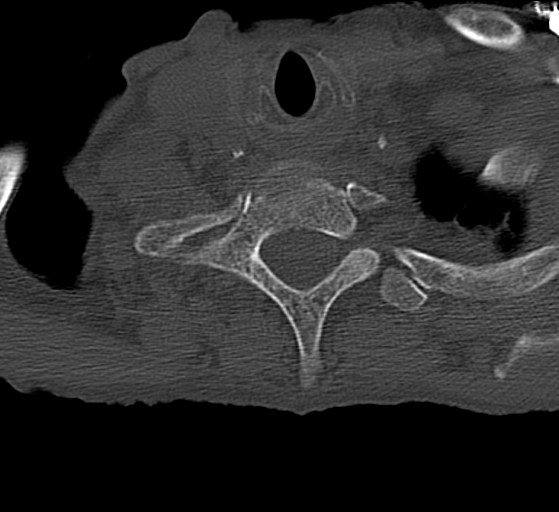
[im 39/97  bone]
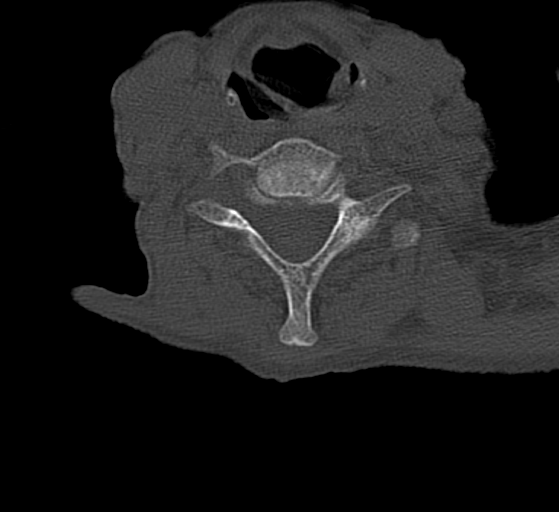
[im 58/97  bone]
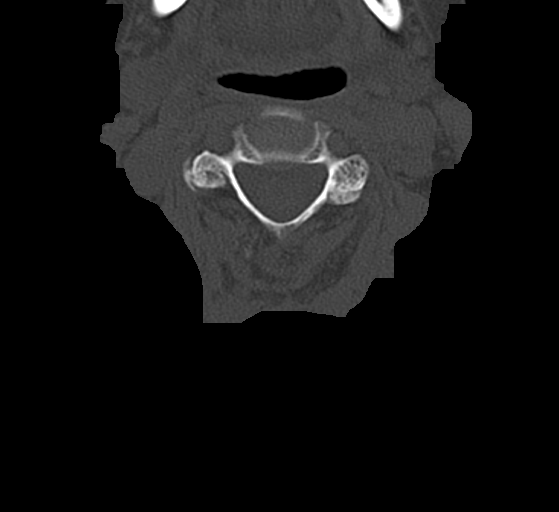
[im 68/97  bone]
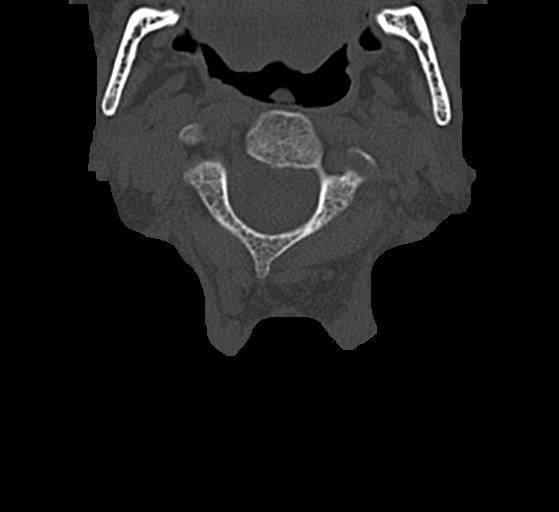
[im 77/97  bone]
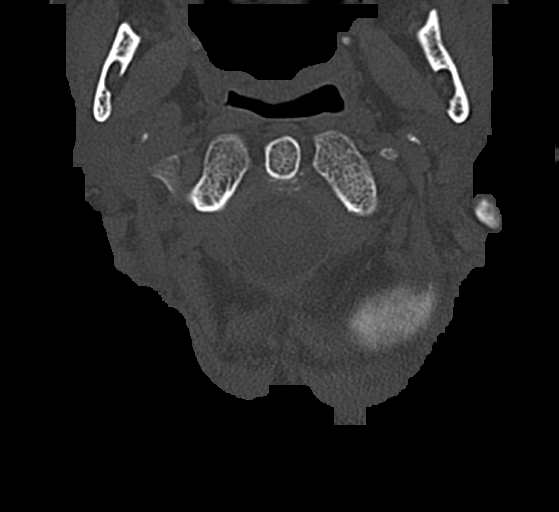
[im 87/97  bone]
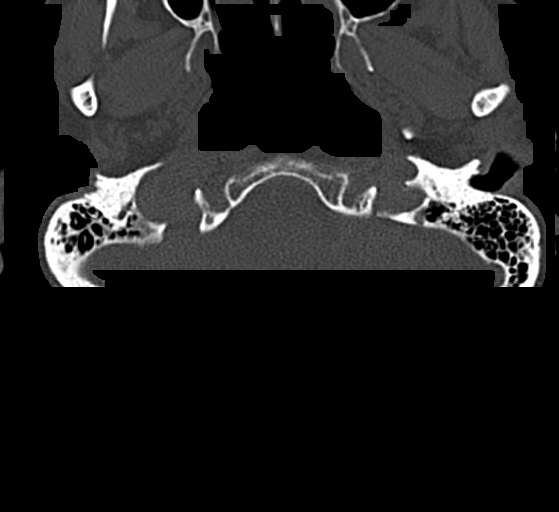

[15 of 47 positions shown; findings below may reference images not displayed]

FINDINGS: CT HEAD FINDINGS

Brain: There is no evidence of acute intracranial hemorrhage, mass
lesion, brain edema or extra-axial fluid collection. There is
diffuse prominence of the ventricles and subarachnoid spaces
consistent with mild atrophy for age. Interval development of
subcortical encephalomalacia in the right parietal lobe. There is no
CT evidence of acute cortical infarction.

Vascular: Intracranial vascular calcifications are noted.

Skull: Negative for fracture or focal lesion.

Sinuses/Orbits: The visualized paranasal sinuses and mastoid air
cells are clear. No orbital abnormalities are seen.

Other: There is a large left supraorbital scalp hematoma.

CT CERVICAL SPINE FINDINGS

Alignment: Normal.

Skull base and vertebrae: No evidence of acute cervical spine
fracture or traumatic subluxation.

Soft tissues and spinal canal: No acute soft tissue findings are
demonstrated. There is no evidence of canal hematoma. Extensive
carotid atherosclerosis is present bilaterally.

Disc levels: Minimal spondylosis for age with preserved disc spaces.
There is mild facet hypertrophy, greatest on the right at C3-4.

Upper chest: No significant findings.

Other: TMJ degenerative changes are noted bilaterally.
IMPRESSION: 1. Large left supraorbital hematoma. No evidence of orbital injury
or fracture.
2. No acute intracranial findings.
3. Generalized atrophy with interval development of right parietal
subcortical encephalomalacia.
4. No evidence of acute cervical spine fracture, traumatic
subluxation or static signs of instability.
